# Patient Record
Sex: Female | Born: 1965 | Race: White | Hispanic: No | Marital: Single | Smoking: Never smoker
Health system: Southern US, Community
[De-identification: ages and names within clinical notes are randomized; demographics above are authoritative.]

## PROBLEM LIST (undated history)

## (undated) DIAGNOSIS — M2550 Pain in unspecified joint: Secondary | ICD-10-CM

## (undated) DIAGNOSIS — G4730 Sleep apnea, unspecified: Secondary | ICD-10-CM

## (undated) DIAGNOSIS — M1990 Unspecified osteoarthritis, unspecified site: Secondary | ICD-10-CM

## (undated) HISTORY — PX: OTHER SURGICAL HISTORY: SHX169

## (undated) HISTORY — PX: WISDOM TOOTH EXTRACTION: SHX21

## (undated) HISTORY — DX: Pain in unspecified joint: M25.50

---

## 2007-04-24 ENCOUNTER — Emergency Department: Payer: Self-pay | Admitting: Emergency Medicine

## 2007-07-03 ENCOUNTER — Emergency Department: Payer: Self-pay

## 2007-11-10 ENCOUNTER — Ambulatory Visit: Payer: Self-pay

## 2009-03-28 ENCOUNTER — Ambulatory Visit: Payer: Self-pay

## 2012-12-30 HISTORY — PX: KNEE ARTHROSCOPY: SUR90

## 2013-12-01 ENCOUNTER — Other Ambulatory Visit (HOSPITAL_COMMUNITY): Payer: Self-pay | Admitting: Orthopedic Surgery

## 2013-12-01 DIAGNOSIS — M25562 Pain in left knee: Secondary | ICD-10-CM

## 2013-12-03 ENCOUNTER — Ambulatory Visit (HOSPITAL_COMMUNITY)
Admission: RE | Admit: 2013-12-03 | Discharge: 2013-12-03 | Disposition: A | Discharge status: Home / Self Care | Payer: BC Managed Care – PPO | Source: Ambulatory Visit | Attending: Orthopedic Surgery | Admitting: Orthopedic Surgery

## 2013-12-03 DIAGNOSIS — M25569 Pain in unspecified knee: Secondary | ICD-10-CM | POA: Insufficient documentation

## 2013-12-03 DIAGNOSIS — M76899 Other specified enthesopathies of unspecified lower limb, excluding foot: Secondary | ICD-10-CM | POA: Insufficient documentation

## 2013-12-03 DIAGNOSIS — M25469 Effusion, unspecified knee: Secondary | ICD-10-CM | POA: Insufficient documentation

## 2013-12-03 DIAGNOSIS — M6750 Plica syndrome, unspecified knee: Secondary | ICD-10-CM | POA: Insufficient documentation

## 2013-12-03 DIAGNOSIS — IMO0002 Reserved for concepts with insufficient information to code with codable children: Secondary | ICD-10-CM | POA: Insufficient documentation

## 2013-12-03 DIAGNOSIS — M25562 Pain in left knee: Secondary | ICD-10-CM

## 2013-12-03 DIAGNOSIS — X58XXXA Exposure to other specified factors, initial encounter: Secondary | ICD-10-CM | POA: Insufficient documentation

## 2013-12-09 ENCOUNTER — Ambulatory Visit (HOSPITAL_COMMUNITY): Payer: Self-pay

## 2015-10-24 ENCOUNTER — Other Ambulatory Visit (HOSPITAL_COMMUNITY): Payer: Self-pay | Admitting: Internal Medicine

## 2015-10-24 ENCOUNTER — Ambulatory Visit (HOSPITAL_COMMUNITY)
Admission: RE | Admit: 2015-10-24 | Discharge: 2015-10-24 | Disposition: A | Discharge status: Home / Self Care | Payer: BC Managed Care – PPO | Source: Ambulatory Visit | Attending: Internal Medicine | Admitting: Internal Medicine

## 2015-10-24 DIAGNOSIS — M25562 Pain in left knee: Secondary | ICD-10-CM | POA: Diagnosis present

## 2015-10-24 DIAGNOSIS — M25762 Osteophyte, left knee: Secondary | ICD-10-CM | POA: Diagnosis not present

## 2015-10-24 DIAGNOSIS — W19XXXA Unspecified fall, initial encounter: Secondary | ICD-10-CM

## 2018-07-28 ENCOUNTER — Encounter: Payer: Self-pay | Admitting: Internal Medicine

## 2018-10-01 ENCOUNTER — Other Ambulatory Visit (HOSPITAL_BASED_OUTPATIENT_CLINIC_OR_DEPARTMENT_OTHER): Payer: Self-pay

## 2018-10-01 DIAGNOSIS — G4733 Obstructive sleep apnea (adult) (pediatric): Secondary | ICD-10-CM

## 2018-10-01 DIAGNOSIS — R0683 Snoring: Secondary | ICD-10-CM

## 2018-10-14 ENCOUNTER — Ambulatory Visit: Payer: BC Managed Care – PPO | Attending: Internal Medicine | Admitting: Neurology

## 2018-10-14 ENCOUNTER — Encounter: Payer: Self-pay | Admitting: Gastroenterology

## 2018-10-14 ENCOUNTER — Telehealth: Payer: Self-pay

## 2018-10-14 ENCOUNTER — Ambulatory Visit: Payer: BC Managed Care – PPO | Admitting: Gastroenterology

## 2018-10-14 ENCOUNTER — Other Ambulatory Visit: Payer: Self-pay

## 2018-10-14 DIAGNOSIS — Z1211 Encounter for screening for malignant neoplasm of colon: Secondary | ICD-10-CM | POA: Insufficient documentation

## 2018-10-14 DIAGNOSIS — G4733 Obstructive sleep apnea (adult) (pediatric): Secondary | ICD-10-CM | POA: Diagnosis not present

## 2018-10-14 DIAGNOSIS — R0683 Snoring: Secondary | ICD-10-CM

## 2018-10-14 MED ORDER — PEG 3350-KCL-NA BICARB-NACL 420 G PO SOLR: 4000.0000 mL | ORAL | 0 refills | Status: DC

## 2018-10-14 MED ORDER — NA SULFATE-K SULFATE-MG SULF 17.5-3.13-1.6 GM/177ML PO SOLN: 1.0000 | ORAL | 0 refills | Status: DC

## 2018-10-14 NOTE — H&P (View-Only) (Signed)
Primary Care Physician:  Asencion Noble, MD Primary Gastroenterologist:  Dr. Gala Romney   Chief Complaint  Patient presents with  . Consult    TCS- has no had TCS prior, no FH of colon cancer    HPI:   Susan Robertson is a 52 y.o. female presenting today at the request of Dr. Willey Blade to arrange initial screening colonoscopy. She has no family history of colorectal cancer. Father had colonoscopy at age 65 with simple adenomas.   No abdominal pain, N/V, GERD, dysphagia, overt GI bleeding, changes in bowel habits. She has a sleep study tonight. No prior diagnosis of sleep apnea. Told that she snores. No concerning GI issues. She was brought in today due to need for Propofol (BMI 52).   Past Medical History:  Diagnosis Date  . Joint pain     Past Surgical History:  Procedure Laterality Date  . ingrown toenail    . KNEE ARTHROSCOPY  2014  . WISDOM TOOTH EXTRACTION      Current Outpatient Medications  Medication Sig Dispense Refill  . Homeopathic Products (ZICAM COLD REMEDY PO) Take by mouth as needed.    Marland Kitchen ibuprofen (ADVIL,MOTRIN) 200 MG tablet Take 200 mg by mouth every 6 (six) hours as needed.    . Pseudoephedrine-Ibuprofen 30-200 MG TABS Take by mouth as needed.    . Na Sulfate-K Sulfate-Mg Sulf (SUPREP BOWEL PREP KIT) 17.5-3.13-1.6 GM/177ML SOLN Take 1 kit by mouth as directed. 1 Bottle 0  . polyethylene glycol-electrolytes (TRILYTE) 420 g solution Take 4,000 mLs by mouth as directed. 4000 mL 0   No current facility-administered medications for this visit.     Allergies as of 10/14/2018  . (No Known Allergies)    Family History  Problem Relation Age of Onset  . Colon polyps Father 16  . Colon cancer Neg Hx     Social History   Socioeconomic History  . Marital status: Single    Spouse name: Not on file  . Number of children: Not on file  . Years of education: Not on file  . Highest education level: Not on file  Occupational History    Comment: nurse educator  Social  Needs  . Financial resource strain: Not on file  . Food insecurity:    Worry: Not on file    Inability: Not on file  . Transportation needs:    Medical: Not on file    Non-medical: Not on file  Tobacco Use  . Smoking status: Never Smoker  . Smokeless tobacco: Never Used  Substance and Sexual Activity  . Alcohol use: Yes    Comment: occas  . Drug use: Never  . Sexual activity: Not on file  Lifestyle  . Physical activity:    Days per week: Not on file    Minutes per session: Not on file  . Stress: Not on file  Relationships  . Social connections:    Talks on phone: Not on file    Gets together: Not on file    Attends religious service: Not on file    Active member of club or organization: Not on file    Attends meetings of clubs or organizations: Not on file    Relationship status: Not on file  . Intimate partner violence:    Fear of current or ex partner: Not on file    Emotionally abused: Not on file    Physically abused: Not on file    Forced sexual activity: Not on file  Other  Topics Concern  . Not on file  Social History Narrative  . Not on file    Review of Systems: Gen: Denies any fever, chills, fatigue, weight loss, lack of appetite.  CV: Denies chest pain, heart palpitations, peripheral edema, syncope.  Resp: Denies shortness of breath at rest or with exertion. Denies wheezing or cough.  GI: see HPI  GU : Denies urinary burning, urinary frequency, urinary hesitancy MS: Denies joint pain, muscle weakness, cramps, or limitation of movement.  Derm: Denies rash, itching, dry skin Psych: Denies depression, anxiety, memory loss, and confusion Heme: Denies bruising, bleeding, and enlarged lymph nodes.  Physical Exam: BP (!) 144/91   Pulse 80   Temp (!) 97 F (36.1 C) (Oral)   Ht 5' 7"  (1.702 m)   Wt (!) 335 lb 3.2 oz (152 kg)   LMP 07/30/2018   BMI 52.50 kg/m  General:   Alert and oriented. Pleasant and cooperative. Well-nourished and well-developed.    Head:  Normocephalic and atraumatic. Eyes:  Without icterus, sclera clear and conjunctiva pink.  Ears:  Normal auditory acuity. Nose:  No deformity, discharge,  or lesions. Mouth:  No deformity or lesions, oral mucosa pink.  Lungs:  Clear to auscultation bilaterally. No wheezes, rales, or rhonchi. No distress.  Heart:  S1, S2 present without murmurs appreciated.  Abdomen:  +BS, soft, non-tender and non-distended. No HSM noted. No guarding or rebound. No masses appreciated.  Rectal:  Deferred  Msk:  Symmetrical without gross deformities. Normal posture. Extremities:  Without  edema. Neurologic:  Alert and  oriented x4 Psych:  Alert and cooperative. Normal mood and affect.

## 2018-10-14 NOTE — Telephone Encounter (Signed)
Tried to call pt to inform of pre-op appt 10/28/18 at 1:15pm, no answer, LMOVM. Letter mailed.

## 2018-10-14 NOTE — Assessment & Plan Note (Signed)
52 year old very pleasant female due for initial screening colonoscopy. She has no concerning lower or upper GI signs/symptoms. Personal family history of colon polyps in father, but he was age 27. No FH colorectal cancer.   Proceed with TCS with Dr. Jena Gauss in near future: the risks, benefits, and alternatives have been discussed with the patient in detail. The patient states understanding and desires to proceed. Propofol due to BMI 52

## 2018-10-14 NOTE — Patient Instructions (Signed)
We have scheduled you for a colonoscopy with Dr. Rourk in the near future!  Further recommendations to follow.  It was a pleasure to see you today. I strive to create trusting relationships with patients to provide genuine, compassionate, and quality care. I value your feedback. If you receive a survey regarding your visit,  I greatly appreciate you taking time to fill this out.   Gae Bihl W. Amaree Leeper, PhD, ANP-BC [facility]    

## 2018-10-14 NOTE — Progress Notes (Signed)
Primary Care Physician:  Asencion Noble, MD Primary Gastroenterologist:  Dr. Gala Romney   Chief Complaint  Patient presents with  . Consult    TCS- has no had TCS prior, no FH of colon cancer    HPI:   Susan Robertson is a 52 y.o. female presenting today at the request of Dr. Willey Blade to arrange initial screening colonoscopy. She has no family history of colorectal cancer. Father had colonoscopy at age 82 with simple adenomas.   No abdominal pain, N/V, GERD, dysphagia, overt GI bleeding, changes in bowel habits. She has a sleep study tonight. No prior diagnosis of sleep apnea. Told that she snores. No concerning GI issues. She was brought in today due to need for Propofol (BMI 52).   Past Medical History:  Diagnosis Date  . Joint pain     Past Surgical History:  Procedure Laterality Date  . ingrown toenail    . KNEE ARTHROSCOPY  2014  . WISDOM TOOTH EXTRACTION      Current Outpatient Medications  Medication Sig Dispense Refill  . Homeopathic Products (ZICAM COLD REMEDY PO) Take by mouth as needed.    Marland Kitchen ibuprofen (ADVIL,MOTRIN) 200 MG tablet Take 200 mg by mouth every 6 (six) hours as needed.    . Pseudoephedrine-Ibuprofen 30-200 MG TABS Take by mouth as needed.    . Na Sulfate-K Sulfate-Mg Sulf (SUPREP BOWEL PREP KIT) 17.5-3.13-1.6 GM/177ML SOLN Take 1 kit by mouth as directed. 1 Bottle 0  . polyethylene glycol-electrolytes (TRILYTE) 420 g solution Take 4,000 mLs by mouth as directed. 4000 mL 0   No current facility-administered medications for this visit.     Allergies as of 10/14/2018  . (No Known Allergies)    Family History  Problem Relation Age of Onset  . Colon polyps Father 23  . Colon cancer Neg Hx     Social History   Socioeconomic History  . Marital status: Single    Spouse name: Not on file  . Number of children: Not on file  . Years of education: Not on file  . Highest education level: Not on file  Occupational History    Comment: nurse educator  Social  Needs  . Financial resource strain: Not on file  . Food insecurity:    Worry: Not on file    Inability: Not on file  . Transportation needs:    Medical: Not on file    Non-medical: Not on file  Tobacco Use  . Smoking status: Never Smoker  . Smokeless tobacco: Never Used  Substance and Sexual Activity  . Alcohol use: Yes    Comment: occas  . Drug use: Never  . Sexual activity: Not on file  Lifestyle  . Physical activity:    Days per week: Not on file    Minutes per session: Not on file  . Stress: Not on file  Relationships  . Social connections:    Talks on phone: Not on file    Gets together: Not on file    Attends religious service: Not on file    Active member of club or organization: Not on file    Attends meetings of clubs or organizations: Not on file    Relationship status: Not on file  . Intimate partner violence:    Fear of current or ex partner: Not on file    Emotionally abused: Not on file    Physically abused: Not on file    Forced sexual activity: Not on file  Other  Topics Concern  . Not on file  Social History Narrative  . Not on file    Review of Systems: Gen: Denies any fever, chills, fatigue, weight loss, lack of appetite.  CV: Denies chest pain, heart palpitations, peripheral edema, syncope.  Resp: Denies shortness of breath at rest or with exertion. Denies wheezing or cough.  GI: see HPI  GU : Denies urinary burning, urinary frequency, urinary hesitancy MS: Denies joint pain, muscle weakness, cramps, or limitation of movement.  Derm: Denies rash, itching, dry skin Psych: Denies depression, anxiety, memory loss, and confusion Heme: Denies bruising, bleeding, and enlarged lymph nodes.  Physical Exam: BP (!) 144/91   Pulse 80   Temp (!) 97 F (36.1 C) (Oral)   Ht 5' 7"  (1.702 m)   Wt (!) 335 lb 3.2 oz (152 kg)   LMP 07/30/2018   BMI 52.50 kg/m  General:   Alert and oriented. Pleasant and cooperative. Well-nourished and well-developed.    Head:  Normocephalic and atraumatic. Eyes:  Without icterus, sclera clear and conjunctiva pink.  Ears:  Normal auditory acuity. Nose:  No deformity, discharge,  or lesions. Mouth:  No deformity or lesions, oral mucosa pink.  Lungs:  Clear to auscultation bilaterally. No wheezes, rales, or rhonchi. No distress.  Heart:  S1, S2 present without murmurs appreciated.  Abdomen:  +BS, soft, non-tender and non-distended. No HSM noted. No guarding or rebound. No masses appreciated.  Rectal:  Deferred  Msk:  Symmetrical without gross deformities. Normal posture. Extremities:  Without  edema. Neurologic:  Alert and  oriented x4 Psych:  Alert and cooperative. Normal mood and affect.

## 2018-10-14 NOTE — Progress Notes (Signed)
CC'D TO PCP °

## 2018-10-22 NOTE — Patient Instructions (Signed)
Susan Robertson  10/22/2018     @PREFPERIOPPHARMACY @   Your procedure is scheduled on  11/05/2018 .  Report to [facility] at  1200  P.M.  Call this number if you have problems the morning of surgery:  [phone]   Remember:  Follow the diet and prep instructions given to you by Dr Luvenia Starch office.                      Take these medicines the morning of surgery with A SIP OF WATER  None    Do not wear jewelry, make-up or nail polish.  Do not wear lotions, powders, or perfumes, or deodorant.  Do not shave 48 hours prior to surgery.  Men may shave face and neck.  Do not bring valuables to the hospital.  [facility] is not responsible for any belongings or valuables.  Contacts, dentures or bridgework may not be worn into surgery.  Leave your suitcase in the car.  After surgery it may be brought to your room.  For patients admitted to the hospital, discharge time will be determined by your treatment team.  Patients discharged the day of surgery will not be allowed to drive home.   Name and phone number of your driver:   family Special instructions:  None  Please read over the following fact sheets that you were given. Anesthesia Post-op Instructions and Care and Recovery After Surgery       Colonoscopy, Adult A colonoscopy is an exam to look at the large intestine. It is done to check for problems, such as:  Lumps (tumors).  Growths (polyps).  Swelling (inflammation).  Bleeding.  What happens before the procedure? Eating and drinking Follow instructions from your doctor about eating and drinking. These instructions may include:  A few days before the procedure - follow a low-fiber diet. ? Avoid nuts. ? Avoid seeds. ? Avoid dried fruit. ? Avoid raw fruits. ? Avoid vegetables.  1-3 days before the procedure - follow a clear liquid diet. Avoid liquids that have red or purple dye. Drink only clear liquids, such as: ? Clear broth or  bouillon. ? Black coffee or tea. ? Clear juice. ? Clear soft drinks or sports drinks. ? Gelatin dessert. ? Popsicles.  On the day of the procedure - do not eat or drink anything during the 2 hours before the procedure.  Bowel prep If you were prescribed an oral bowel prep:  Take it as told by your doctor. Starting the day before your procedure, you will need to drink a lot of liquid. The liquid will cause you to poop (have bowel movements) until your poop is almost clear or light green.  If your skin or butt gets irritated from diarrhea, you may: ? Wipe the area with wipes that have medicine in them, such as adult wet wipes with aloe and vitamin E. ? Put something on your skin that soothes the area, such as petroleum jelly.  If you throw up (vomit) while drinking the bowel prep, take a break for up to 60 minutes. Then begin the bowel prep again. If you keep throwing up and you cannot take the bowel prep without throwing up, call your doctor.  General instructions  Ask your doctor about changing or stopping your normal medicines. This is important if you take diabetes medicines or blood thinners.  Plan to have someone take you home from the hospital or clinic.  What happens during the procedure?  An IV tube may be put into one of your veins.  You will be given medicine to help you relax (sedative).  To reduce your risk of infection: ? Your doctors will wash their hands. ? Your anal area will be washed with soap.  You will be asked to lie on your side with your knees bent.  Your doctor will get a long, thin, flexible tube ready. The tube will have a camera and a light on the end.  The tube will be put into your anus.  The tube will be gently put into your large intestine.  Air will be delivered into your large intestine to keep it open. You may feel some pressure or cramping.  The camera will be used to take photos.  A small tissue sample may be removed from your body to  be looked at under a microscope (biopsy). If any possible problems are found, the tissue will be sent to a lab for testing.  If small growths are found, your doctor may remove them and have them checked for cancer.  The tube that was put into your anus will be slowly removed. The procedure may vary among doctors and hospitals. What happens after the procedure?  Your doctor will check on you often until the medicines you were given have worn off.  Do not drive for 24 hours after the procedure.  You may have a small amount of blood in your poop.  You may pass gas.  You may have mild cramps or bloating in your belly (abdomen).  It is up to you to get the results of your procedure. Ask your doctor, or the department performing the procedure, when your results will be ready. This information is not intended to replace advice given to you by your health care provider. Make sure you discuss any questions you have with your health care provider. Document Released: 01/18/2011 Document Revised: 10/16/2016 Document Reviewed: 02/27/2016 Elsevier Interactive Patient Education  2017 [employer]  Colonoscopy, Adult, Care After This sheet gives you information about how to care for yourself after your procedure. Your health care provider may also give you more specific instructions. If you have problems or questions, contact your health care provider. What can I expect after the procedure? After the procedure, it is common to have:  A small amount of blood in your stool for 24 hours after the procedure.  Some gas.  Mild abdominal cramping or bloating.  Follow these instructions at home: General instructions   For the first 24 hours after the procedure: ? Do not drive or use machinery. ? Do not sign important documents. ? Do not drink alcohol. ? Do your regular daily activities at a slower pace than normal. ? Eat soft, easy-to-digest foods. ? Rest often.  Take over-the-counter or  prescription medicines only as told by your health care provider.  It is up to you to get the results of your procedure. Ask your health care provider, or the department performing the procedure, when your results will be ready. Relieving cramping and bloating  Try walking around when you have cramps or feel bloated.  Apply heat to your abdomen as told by your health care provider. Use a heat source that your health care provider recommends, such as a moist heat pack or a heating pad. ? Place a towel between your skin and the heat source. ? Leave the heat on for 20-30 minutes. ? Remove the heat if your  skin turns bright red. This is especially important if you are unable to feel pain, heat, or cold. You may have a greater risk of getting burned. Eating and drinking  Drink enough fluid to keep your urine clear or pale yellow.  Resume your normal diet as instructed by your health care provider. Avoid heavy or fried foods that are hard to digest.  Avoid drinking alcohol for as long as instructed by your health care provider. Contact a health care provider if:  You have blood in your stool 2-3 days after the procedure. Get help right away if:  You have more than a small spotting of blood in your stool.  You pass large blood clots in your stool.  Your abdomen is swollen.  You have nausea or vomiting.  You have a fever.  You have increasing abdominal pain that is not relieved with medicine. This information is not intended to replace advice given to you by your health care provider. Make sure you discuss any questions you have with your health care provider. Document Released: 07/30/2004 Document Revised: 09/09/2016 Document Reviewed: 02/27/2016 Elsevier Interactive Patient Education  2018 [place] Anesthesia is a term that refers to techniques, procedures, and medicines that help a person stay safe and comfortable during a medical procedure.  Monitored anesthesia care, or sedation, is one type of anesthesia. Your anesthesia specialist may recommend sedation if you will be having a procedure that does not require you to be unconscious, such as:  Cataract surgery.  A dental procedure.  A biopsy.  A colonoscopy.  During the procedure, you may receive a medicine to help you relax (sedative). There are three levels of sedation:  Mild sedation. At this level, you may feel awake and relaxed. You will be able to follow directions.  Moderate sedation. At this level, you will be sleepy. You may not remember the procedure.  Deep sedation. At this level, you will be asleep. You will not remember the procedure.  The more medicine you are given, the deeper your level of sedation will be. Depending on how you respond to the procedure, the anesthesia specialist may change your level of sedation or the type of anesthesia to fit your needs. An anesthesia specialist will monitor you closely during the procedure. Let your health care provider know about:  Any allergies you have.  All medicines you are taking, including vitamins, herbs, eye drops, creams, and over-the-counter medicines.  Any use of steroids (by mouth or as a cream).  Any problems you or family members have had with sedatives and anesthetic medicines.  Any blood disorders you have.  Any surgeries you have had.  Any medical conditions you have, such as sleep apnea.  Whether you are pregnant or may be pregnant.  Any use of cigarettes, alcohol, or street drugs. What are the risks? Generally, this is a safe procedure. However, problems may occur, including:  Getting too much medicine (oversedation).  Nausea.  Allergic reaction to medicines.  Trouble breathing. If this happens, a breathing tube may be used to help with breathing. It will be removed when you are awake and breathing on your own.  Heart trouble.  Lung trouble.  Before the procedure Staying  hydrated Follow instructions from your health care provider about hydration, which may include:  Up to 2 hours before the procedure - you may continue to drink clear liquids, such as water, clear fruit juice, black coffee, and plain tea.  Eating and drinking restrictions Follow  instructions from your health care provider about eating and drinking, which may include:  8 hours before the procedure - stop eating heavy meals or foods such as meat, fried foods, or fatty foods.  6 hours before the procedure - stop eating light meals or foods, such as toast or cereal.  6 hours before the procedure - stop drinking milk or drinks that contain milk.  2 hours before the procedure - stop drinking clear liquids.  Medicines Ask your health care provider about:  Changing or stopping your regular medicines. This is especially important if you are taking diabetes medicines or blood thinners.  Taking medicines such as aspirin and ibuprofen. These medicines can thin your blood. Do not take these medicines before your procedure if your health care provider instructs you not to.  Tests and exams  You will have a physical exam.  You may have blood tests done to show: ? How well your kidneys and liver are working. ? How well your blood can clot.  General instructions  Plan to have someone take you home from the hospital or clinic.  If you will be going home right after the procedure, plan to have someone with you for 24 hours.  What happens during the procedure?  Your blood pressure, heart rate, breathing, level of pain and overall condition will be monitored.  An IV tube will be inserted into one of your veins.  Your anesthesia specialist will give you medicines as needed to keep you comfortable during the procedure. This may mean changing the level of sedation.  The procedure will be performed. After the procedure  Your blood pressure, heart rate, breathing rate, and blood oxygen level  will be monitored until the medicines you were given have worn off.  Do not drive for 24 hours if you received a sedative.  You may: ? Feel sleepy, clumsy, or nauseous. ? Feel forgetful about what happened after the procedure. ? Have a sore throat if you had a breathing tube during the procedure. ? Vomit. This information is not intended to replace advice given to you by your health care provider. Make sure you discuss any questions you have with your health care provider. Document Released: 09/11/2005 Document Revised: 05/24/2016 Document Reviewed: 04/07/2016 Elsevier Interactive Patient Education  2018 [place], Care After These instructions provide you with information about caring for yourself after your procedure. Your health care provider may also give you more specific instructions. Your treatment has been planned according to current medical practices, but problems sometimes occur. Call your health care provider if you have any problems or questions after your procedure. What can I expect after the procedure? After your procedure, it is common to:  Feel sleepy for several hours.  Feel clumsy and have poor balance for several hours.  Feel forgetful about what happened after the procedure.  Have poor judgment for several hours.  Feel nauseous or vomit.  Have a sore throat if you had a breathing tube during the procedure.  Follow these instructions at home: For at least 24 hours after the procedure:   Do not: ? Participate in activities in which you could fall or become injured. ? Drive. ? Use heavy machinery. ? Drink alcohol. ? Take sleeping pills or medicines that cause drowsiness. ? Make important decisions or sign legal documents. ? Take care of children on your own.  Rest. Eating and drinking  Follow the diet that is recommended by your health care provider.  If  you vomit, drink water, juice, or soup when you can drink without  vomiting.  Make sure you have little or no nausea before eating solid foods. General instructions  Have a responsible adult stay with you until you are awake and alert.  Take over-the-counter and prescription medicines only as told by your health care provider.  If you smoke, do not smoke without supervision.  Keep all follow-up visits as told by your health care provider. This is important. Contact a health care provider if:  You keep feeling nauseous or you keep vomiting.  You feel light-headed.  You develop a rash.  You have a fever. Get help right away if:  You have trouble breathing. This information is not intended to replace advice given to you by your health care provider. Make sure you discuss any questions you have with your health care provider. Document Released: 04/07/2016 Document Revised: 08/07/2016 Document Reviewed: 04/07/2016 Elsevier Interactive Patient Education  [employer].

## 2018-10-28 ENCOUNTER — Other Ambulatory Visit (HOSPITAL_COMMUNITY): Payer: BC Managed Care – PPO

## 2018-10-29 ENCOUNTER — Encounter (HOSPITAL_COMMUNITY): Payer: Self-pay

## 2018-10-29 ENCOUNTER — Encounter (HOSPITAL_COMMUNITY)
Admission: RE | Admit: 2018-10-29 | Discharge: 2018-10-29 | Disposition: A | Discharge status: Home / Self Care | Payer: BC Managed Care – PPO | Source: Ambulatory Visit | Attending: Internal Medicine | Admitting: Internal Medicine

## 2018-10-29 ENCOUNTER — Other Ambulatory Visit: Payer: Self-pay

## 2018-10-29 DIAGNOSIS — Z01812 Encounter for preprocedural laboratory examination: Secondary | ICD-10-CM | POA: Diagnosis not present

## 2018-10-29 HISTORY — DX: Sleep apnea, unspecified: G47.30

## 2018-10-29 HISTORY — DX: Unspecified osteoarthritis, unspecified site: M19.90

## 2018-10-29 LAB — CBC WITH DIFFERENTIAL/PLATELET
Abs Immature Granulocytes: 0.03 10*3/uL (ref 0.00–0.07)
BASOS ABS: 0 10*3/uL (ref 0.0–0.1)
BASOS PCT: 0 %
EOS ABS: 0.1 10*3/uL (ref 0.0–0.5)
Eosinophils Relative: 2 %
HCT: 42.7 % (ref 36.0–46.0)
Hemoglobin: 12.8 g/dL (ref 12.0–15.0)
IMMATURE GRANULOCYTES: 0 %
Lymphocytes Relative: 24 %
Lymphs Abs: 1.8 10*3/uL (ref 0.7–4.0)
MCH: 26.9 pg (ref 26.0–34.0)
MCHC: 30 g/dL (ref 30.0–36.0)
MCV: 89.9 fL (ref 80.0–100.0)
Monocytes Absolute: 0.5 10*3/uL (ref 0.1–1.0)
Monocytes Relative: 7 %
NEUTROS PCT: 67 %
NRBC: 0 % (ref 0.0–0.2)
Neutro Abs: 4.8 10*3/uL (ref 1.7–7.7)
PLATELETS: 328 10*3/uL (ref 150–400)
RBC: 4.75 MIL/uL (ref 3.87–5.11)
RDW: 13.5 % (ref 11.5–15.5)
WBC: 7.2 10*3/uL (ref 4.0–10.5)

## 2018-10-29 LAB — HCG, SERUM, QUALITATIVE: Preg, Serum: NEGATIVE

## 2018-10-31 NOTE — Procedures (Signed)
Bath A. Merlene Laughter, MD     [email or web address]             NOCTURNAL POLYSOMNOGRAPHY   LOCATION: [facility]     Patient Name: Susan Robertson, Susan Robertson Date: 10/14/2018 Gender: Female D.O.B: 27-Sep-1966 Age (years): 52 Referring Provider: Asencion Noble Height (inches): 23 Interpreting Physician: Phillips Odor MD, ABSM Weight (lbs): 320 RPSGT: Peak, Robert BMI: 50 MRN: [id number] Neck Size: 17.00 CLINICAL INFORMATION Sleep Study Type: Split Night CPAP     Indication for sleep study: OSA, Snoring     Epworth Sleepiness Score: 12  SLEEP STUDY TECHNIQUE As per the AASM Manual for the Scoring of Sleep and Associated Events v2.3 (April 2016) with a hypopnea requiring 4% desaturations.  The channels recorded and monitored were frontal, central and occipital EEG, electrooculogram (EOG), submentalis EMG (chin), nasal and oral airflow, thoracic and abdominal wall motion, anterior tibialis EMG, snore microphone, electrocardiogram, and pulse oximetry. Continuous positive airway pressure (CPAP) was initiated when the patient met split night criteria and was titrated according to treat sleep-disordered breathing.  MEDICATIONS Medications self-administered by patient taken the night of the study : N/A  Current Outpatient Medications:  .  ibuprofen (ADVIL,MOTRIN) 200 MG tablet, Take 400-600 mg by mouth every 6 (six) hours as needed for headache or moderate pain. , Disp: , Rfl:  .  Na Sulfate-K Sulfate-Mg Sulf (SUPREP BOWEL PREP KIT) 17.5-3.13-1.6 GM/177ML SOLN, Take 1 kit by mouth as directed. (Patient not taking: Reported on 10/27/2018), Disp: 1 Bottle, Rfl: 0 .  polyethylene glycol-electrolytes (TRILYTE) 420 g solution, Take 4,000 mLs by mouth as directed., Disp: 4000 mL, Rfl: 0  RESPIRATORY PARAMETERS Diagnostic  Total AHI (/hr): 68.3 RDI (/hr): 107.8 OA Index (/hr): 26.8 CA Index (/hr): 0.5 REM AHI (/hr): N/A NREM AHI (/hr): 68.3 Supine AHI  (/hr): 71.7 Non-supine AHI (/hr): 62.6 Min O2 Sat (%): 84.0 Mean O2 (%): 94.4 Time below 88% (min): 0.6   Titration  Optimal Pressure (cm): 11 AHI at Optimal Pressure (/hr): 0.9 Min O2 at Optimal Pressure (%): 84.0 Supine % at Optimal (%): 100 Sleep % at Optimal (%): 95   SLEEP ARCHITECTURE The recording time for the entire night was 404.4 minutes.  During a baseline period of 188.5 minutes, the patient slept for 123.0 minutes in REM and nonREM, yielding a sleep efficiency of 65.2%%. Sleep onset after lights out was 17.6 minutes with a REM latency of N/A minutes. The patient spent 28.0%% of the night in stage N1 sleep, 72.0%% in stage N2 sleep, 0.0%% in stage N3 and 0% in REM.     During the titration period of 200.7 minutes, the patient slept for 189.3 minutes in REM and nonREM, yielding a sleep efficiency of 94.3%%. Sleep onset after CPAP initiation was 5.0 minutes with a REM latency of 37.0 minutes. The patient spent 4.0%% of the night in stage N1 sleep, 61.8%% in stage N2 sleep, 0.3%% in stage N3 and 34% in REM.  CARDIAC DATA The 2 lead EKG demonstrated sinus rhythm. The mean heart rate was 100.0 beats per minute. Other EKG findings include: None.  LEG MOVEMENT DATA The total Periodic Limb Movements of Sleep (PLMS) were 0. The PLMS index was 0.0.  IMPRESSIONS Severe obstructive sleep apnea occurred during the diagnostic portion of the study (AHI = 68.3/hour). The optimal CPAP selected for this patient is ( 11 cm of water)    Delano Metz, MD Diplomate, American Board of Sleep Medicine.  ELECTRONICALLY SIGNED ON:  10/31/2018,  11:53 AM [place] PH: [phone]   FX: [phone] [place]

## 2018-11-05 ENCOUNTER — Ambulatory Visit (HOSPITAL_COMMUNITY): Payer: BC Managed Care – PPO | Admitting: Anesthesiology

## 2018-11-05 ENCOUNTER — Ambulatory Visit (HOSPITAL_COMMUNITY)
Admission: RE | Admit: 2018-11-05 | Discharge: 2018-11-05 | Disposition: A | Discharge status: Home / Self Care | Payer: BC Managed Care – PPO | Source: Ambulatory Visit | Attending: Internal Medicine | Admitting: Internal Medicine

## 2018-11-05 ENCOUNTER — Other Ambulatory Visit: Payer: Self-pay

## 2018-11-05 ENCOUNTER — Encounter (HOSPITAL_COMMUNITY)
Admission: RE | Disposition: A | Discharge status: Home / Self Care | Payer: Self-pay | Source: Ambulatory Visit | Attending: Internal Medicine

## 2018-11-05 ENCOUNTER — Encounter (HOSPITAL_COMMUNITY): Payer: Self-pay | Admitting: Emergency Medicine

## 2018-11-05 DIAGNOSIS — Z8371 Family history of colonic polyps: Secondary | ICD-10-CM | POA: Diagnosis present

## 2018-11-05 DIAGNOSIS — Z1211 Encounter for screening for malignant neoplasm of colon: Secondary | ICD-10-CM | POA: Insufficient documentation

## 2018-11-05 HISTORY — PX: COLONOSCOPY WITH PROPOFOL: SHX5780

## 2018-11-05 SURGERY — COLONOSCOPY WITH PROPOFOL
Anesthesia: Monitor Anesthesia Care

## 2018-11-05 MED ORDER — ONDANSETRON HCL 4 MG/2ML IJ SOLN
INTRAMUSCULAR | Status: DC | PRN
  Administered 2018-11-05: 4 mg via INTRAVENOUS

## 2018-11-05 MED ORDER — ONDANSETRON HCL 4 MG/2ML IJ SOLN
INTRAMUSCULAR | Status: AC
  Filled 2018-11-05: qty 2

## 2018-11-05 MED ORDER — LACTATED RINGERS IV SOLN
INTRAVENOUS | Status: DC
  Administered 2018-11-05: 09:00:00 via INTRAVENOUS

## 2018-11-05 MED ORDER — CHLORHEXIDINE GLUCONATE CLOTH 2 % EX PADS: 6.0000 | MEDICATED_PAD | Freq: Once | CUTANEOUS | Status: DC

## 2018-11-05 MED ORDER — MIDAZOLAM HCL 5 MG/5ML IJ SOLN
INTRAMUSCULAR | Status: DC | PRN
  Administered 2018-11-05: 2 mg via INTRAVENOUS

## 2018-11-05 MED ORDER — PROPOFOL 500 MG/50ML IV EMUL
INTRAVENOUS | Status: DC | PRN
  Administered 2018-11-05: 135 ug/kg/min via INTRAVENOUS

## 2018-11-05 MED ORDER — STERILE WATER FOR IRRIGATION IR SOLN
Status: DC | PRN
  Administered 2018-11-05: 100 mL

## 2018-11-05 MED ORDER — MIDAZOLAM HCL 2 MG/2ML IJ SOLN
INTRAMUSCULAR | Status: AC
  Filled 2018-11-05: qty 2

## 2018-11-05 NOTE — Transfer of Care (Signed)
Immediate Anesthesia Transfer of Care Note  Patient: Susan Robertson  Procedure(s) Performed: COLONOSCOPY WITH PROPOFOL (N/A )  Patient Location: PACU  Anesthesia Type:MAC  Level of Consciousness: awake and patient cooperative  Airway & Oxygen Therapy: Patient Spontanous Breathing and Patient connected to nasal cannula oxygen  Post-op Assessment: Report given to RN, Post -op Vital signs reviewed and stable and Patient moving all extremities  Post vital signs: Reviewed and stable  Last Vitals:  Vitals Value Taken Time  BP    Temp    Pulse 55 11/05/2018 10:04 AM  Resp    SpO2 84 % 11/05/2018 10:04 AM  Vitals shown include unvalidated device data.  Last Pain:  Vitals:   11/05/18 0958  TempSrc:   PainSc: 0-No pain         Complications: No apparent anesthesia complications

## 2018-11-05 NOTE — Interval H&P Note (Signed)
History and Physical Interval Note:  11/05/2018 9:26 AM  Susan Robertson  has presented today for surgery, with the diagnosis of screening colonoscopy  The various methods of treatment have been discussed with the patient and family. After consideration of risks, benefits and other options for treatment, the patient has consented to  Procedure(s) with comments: COLONOSCOPY WITH PROPOFOL (N/A) - 1:30pm - pt knows to arrive at 8:00 as a surgical intervention .  The patient's history has been reviewed, patient examined, no change in status, stable for surgery.  I have reviewed the patient's chart and labs.  Questions were answered to the patient's satisfaction.    No change.  First ever average her screening colonoscopy today per plan.  The risks, benefits, limitations, alternatives and imponderables have been reviewed with the patient. Questions have been answered. All parties are agreeable.    Susan Robertson

## 2018-11-05 NOTE — Op Note (Signed)
[facility] Patient Name: Susan Robertson Procedure Date: 11/05/2018 9:11 AM MRN: [id number] Date of Birth: 1966-07-26 Attending MD: Gennette Pac , MD CSN: [id number] Age: 52 Admit Type: Outpatient Procedure:                Colonoscopy Indications:              Screening for colorectal malignant neoplasm Providers:                Gennette Pac, MD, Edrick Kins, RN, Dyann Ruddle Referring MD:              Medicines:                Propofol per Anesthesia Complications:            No immediate complications. Estimated Blood Loss:     Estimated blood loss: none. Procedure:                Pre-Anesthesia Assessment:                           - Prior to the procedure, a History and Physical                            was performed, and patient medications and                            allergies were reviewed. The patient's tolerance of                            previous anesthesia was also reviewed. The risks                            and benefits of the procedure and the sedation                            options and risks were discussed with the patient.                            All questions were answered, and informed consent                            was obtained. Prior Anticoagulants: The patient has                            taken no previous anticoagulant or antiplatelet                            agents. ASA Grade Assessment: III - A patient with                            severe systemic disease. After reviewing the risks  and benefits, the patient was deemed in                            satisfactory condition to undergo the procedure.                           After obtaining informed consent, the colonoscope                            was passed under direct vision. Throughout the                            procedure, the patient's blood pressure, pulse, and                            oxygen  saturations were monitored continuously. The                            [id number]-[id number] ([id number]) scope was introduced through                            the and advanced to the the cecum, identified by                            appendiceal orifice and ileocecal valve. The                            colonoscopy was performed without difficulty. The                            patient tolerated the procedure well. The quality                            of the bowel preparation was adequate. Scope In: 9:44:35 AM Scope Out: 9:57:30 AM Scope Withdrawal Time: 0 hours 7 minutes 44 seconds  Total Procedure Duration: 0 hours 12 minutes 55 seconds  Findings:      The perianal and digital rectal examinations were normal.      The colon (entire examined portion) appeared normal. Impression:               - The entire examined colon is normal.                           - No specimens collected. Moderate Sedation:      Moderate (conscious) sedation was personally administered by an       anesthesia professional. The following parameters were monitored: oxygen       saturation, heart rate, blood pressure, respiratory rate, EKG, adequacy       of pulmonary ventilation, and response to care. Recommendation:           - Patient has a contact number available for                            emergencies. The signs and symptoms of potential  delayed complications were discussed with the                            patient. Return to normal activities tomorrow.                            Written discharge instructions were provided to the                            patient.                           - Resume previous diet.                           - Continue present medications.                           - Repeat colonoscopy in 10 years for screening                            purposes.                           - Return to GI office PRN. Procedure Code(s):        --- Professional ---                            [phone], Colonoscopy, flexible; diagnostic, including                            collection of specimen(s) by brushing or washing,                            when performed (separate procedure) Diagnosis Code(s):        --- Professional ---                           Z12.11, Encounter for screening for malignant                            neoplasm of colon CPT copyright 2018 [employer] Association. All rights reserved. The codes documented in this report are preliminary and upon coder review may  be revised to meet current compliance requirements. [name]. [name], MD Gennette Pac, MD 11/05/2018 10:00:48 AM This report has been signed electronically. Number of Addenda: 0

## 2018-11-05 NOTE — Discharge Instructions (Signed)
°  Colonoscopy Discharge Instructions  Read the instructions outlined below and refer to this sheet in the next few weeks. These discharge instructions provide you with general information on caring for yourself after you leave the hospital. Your doctor may also give you specific instructions. While your treatment has been planned according to the most current medical practices available, unavoidable complications occasionally occur. If you have any problems or questions after discharge, call Dr. Jena Gauss at [phone]. ACTIVITY  You may resume your regular activity, but move at a slower pace for the next 24 hours.   Take frequent rest periods for the next 24 hours.   Walking will help get rid of the air and reduce the bloated feeling in your belly (abdomen).   No driving for 24 hours (because of the medicine (anesthesia) used during the test).    Do not sign any important legal documents or operate any machinery for 24 hours (because of the anesthesia used during the test).  NUTRITION  Drink plenty of fluids.   You may resume your normal diet as instructed by your doctor.   Begin with a light meal and progress to your normal diet. Heavy or fried foods are harder to digest and may make you feel sick to your stomach (nauseated).   Avoid alcoholic beverages for 24 hours or as instructed.  MEDICATIONS  You may resume your normal medications unless your doctor tells you otherwise.  WHAT YOU CAN EXPECT TODAY  Some feelings of bloating in the abdomen.   Passage of more gas than usual.   Spotting of blood in your stool or on the toilet paper.  IF YOU HAD POLYPS REMOVED DURING THE COLONOSCOPY:  No aspirin products for 7 days or as instructed.   No alcohol for 7 days or as instructed.   Eat a soft diet for the next 24 hours.  FINDING OUT THE RESULTS OF YOUR TEST Not all test results are available during your visit. If your test results are not back during the visit, make an appointment  with your caregiver to find out the results. Do not assume everything is normal if you have not heard from your caregiver or the medical facility. It is important for you to follow up on all of your test results.  SEEK IMMEDIATE MEDICAL ATTENTION IF:  You have more than a spotting of blood in your stool.   Your belly is swollen (abdominal distention).   You are nauseated or vomiting.   You have a temperature over 101.   You have abdominal pain or discomfort that is severe or gets worse throughout the day.    Your colonoscopy today was normal  Repeat screening colonoscopy in 10 years

## 2018-11-05 NOTE — Anesthesia Preprocedure Evaluation (Signed)
Anesthesia Evaluation  Patient identified by MRN, date of birth, ID band Patient awake    Reviewed: Allergy & Precautions, H&P , NPO status , Patient's Chart, lab work & pertinent test results, reviewed documented beta blocker date and time   Airway Mallampati: II  TM Distance: >3 FB Neck ROM: full  Mouth opening: Limited Mouth Opening  Dental no notable dental hx.    Pulmonary sleep apnea ,    Pulmonary exam normal breath sounds clear to auscultation       Cardiovascular Exercise Tolerance: Good negative cardio ROS   Rhythm:regular Rate:Normal     Neuro/Psych negative neurological ROS  negative psych ROS   GI/Hepatic negative GI ROS, Neg liver ROS,   Endo/Other  Morbid obesity  Renal/GU negative Renal ROS  negative genitourinary   Musculoskeletal   Abdominal   Peds  Hematology negative hematology ROS (+)   Anesthesia Other Findings   Reproductive/Obstetrics negative OB ROS                             Anesthesia Physical Anesthesia Plan  ASA: III  Anesthesia Plan: MAC   Post-op Pain Management:    Induction:   PONV Risk Score and Plan:   Airway Management Planned:   Additional Equipment:   Intra-op Plan:   Post-operative Plan:   Informed Consent: I have reviewed the patients History and Physical, chart, labs and discussed the procedure including the risks, benefits and alternatives for the proposed anesthesia with the patient or authorized representative who has indicated his/her understanding and acceptance.   Dental Advisory Given  Plan Discussed with: CRNA  Anesthesia Plan Comments:         Anesthesia Quick Evaluation

## 2018-11-05 NOTE — Anesthesia Postprocedure Evaluation (Signed)
Anesthesia Post Note  Patient: Susan Robertson  Procedure(s) Performed: COLONOSCOPY WITH PROPOFOL (N/A )  Patient location during evaluation: PACU Anesthesia Type: MAC Level of consciousness: awake and patient cooperative Pain management: pain level controlled Vital Signs Assessment: post-procedure vital signs reviewed and stable Respiratory status: spontaneous breathing, nonlabored ventilation and respiratory function stable Cardiovascular status: blood pressure returned to baseline Postop Assessment: no apparent nausea or vomiting Anesthetic complications: no     Last Vitals:  Vitals:   11/05/18 0813  BP: 136/74  Pulse: 78  Resp: (!) 21  Temp: 36.9 C  SpO2: 94%    Last Pain:  Vitals:   11/05/18 0958  TempSrc:   PainSc: 0-No pain                 [name]

## 2018-11-11 ENCOUNTER — Encounter (HOSPITAL_COMMUNITY): Payer: Self-pay | Admitting: Internal Medicine

## 2018-12-07 ENCOUNTER — Ambulatory Visit: Payer: BC Managed Care – PPO | Admitting: Pulmonary Disease

## 2018-12-07 ENCOUNTER — Encounter: Payer: Self-pay | Admitting: Pulmonary Disease

## 2018-12-07 DIAGNOSIS — G4733 Obstructive sleep apnea (adult) (pediatric): Secondary | ICD-10-CM | POA: Insufficient documentation

## 2018-12-07 DIAGNOSIS — G4720 Circadian rhythm sleep disorder, unspecified type: Secondary | ICD-10-CM | POA: Diagnosis not present

## 2018-12-07 NOTE — Progress Notes (Signed)
Subjective:    Patient ID: Susan Robertson, female    DOB: 07/22/1966, 52 y.o.   MRN: [id number]  HPI  Chief Complaint  Patient presents with  . Sleep Consult    Referred by Dr. Carylon Perches for possible OSA. Per patient, she had a SS back in October 2019 and was told that she had OSA.     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 She reports non-refreshing sleep for many years, she is single and room partner does have occasionally commented on her loud snoring.  She was recently at a conference and her friend observed her sleeping and witnessed apneas.  She reports excessive daytime somnolence and fatigue. Epworth sleepiness score is 10 and she reports sleepiness while sitting and reading, watching TV, lying down to rest in the afternoons. 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 Sleep latency is usually minimal, sleeps on her stomach on one for fibula, denies nocturnal awakenings or nocturia and is out of bed feeling tired with dryness of mouth but denies headaches. 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 Arthritis   . Joint pain   . Sleep apnea    in process of getting CPAP.   Past Surgical History:  Procedure Laterality Date  . COLONOSCOPY WITH PROPOFOL N/A 11/05/2018    Procedure: COLONOSCOPY WITH PROPOFOL;  Surgeon: Corbin Ade, MD;  Location: [facility];  Service: Endoscopy;  Laterality: N/A;  1:30pm - pt knows to arrive at 8:00  . ingrown toenail Bilateral    Great toes  . KNEE ARTHROSCOPY Left 2014  . WISDOM TOOTH EXTRACTION      No Known Allergies   Social History   Socioeconomic History  . Marital status: Single    Spouse name: Not on file  . Number of children: Not on file  . Years of education: Not on file  . Highest education level: Not on file  Occupational History    Comment: nurse educator  Social Needs  . Financial resource strain: Not on file  . Food insecurity:    Worry: Not on file    Inability: Not on file  . Transportation needs:    Medical: Not on file    Non-medical: Not on file  Tobacco Use  . Smoking status: Never Smoker  . Smokeless tobacco: Never Used  Substance and Sexual Activity  . Alcohol use: Yes    Comment: occas  . Drug use: Never  . Sexual activity: Yes    Birth control/protection: None  Lifestyle  . Physical activity:    Days per week: Not on file    Minutes per session: Not on file  . Stress: Not on file  Relationships  . Social connections:  Talks on phone: Not on file    Gets together: Not on file    Attends religious service: Not on file    Active member of club or organization: Not on file    Attends meetings of clubs or organizations: Not on file    Relationship status: Not on file  . Intimate partner violence:    Fear of current or ex partner: Not on file    Emotionally abused: Not on file    Physically abused: Not on file    Forced sexual activity: Not on file  Other Topics Concern  . Not on file  Social History Narrative  . Not on file      Family History  Problem Relation Age of Onset  . Colon polyps Father [id number]  . Colon cancer Neg Hx      Review of Systems  Constitutional: Negative for fever and unexpected weight change.  HENT: Positive for sore throat.  Negative for congestion, dental problem, ear pain, nosebleeds, postnasal drip, rhinorrhea, sinus pressure, sneezing and trouble swallowing.   Eyes: Negative for redness and itching.  Respiratory: Positive for shortness of breath. Negative for cough, chest tightness and wheezing.   Cardiovascular: Negative for palpitations and leg swelling.  Gastrointestinal: Negative for nausea and vomiting.  Genitourinary: Negative for dysuria.  Musculoskeletal: Negative for joint swelling.  Skin: Negative for rash.  Allergic/Immunologic: Negative.  Negative for environmental allergies, food allergies and immunocompromised state.  Neurological: Negative for headaches.  Hematological: Does not bruise/bleed easily.  Psychiatric/Behavioral: Negative for dysphoric mood. The patient is not nervous/anxious.        Objective:   Physical Exam  Gen. Pleasant, obese, in no distress, normal affect ENT - no lesions, no post nasal drip, class 2-3 airway Neck: No JVD, no thyromegaly, no carotid bruits Lungs: no use of accessory muscles, no dullness to percussion, decreased without rales or rhonchi  Cardiovascular: Rhythm regular, heart sounds  normal, no murmurs or gallops, no peripheral edema Abdomen: soft and non-tender, no hepatosplenomegaly, BS normal. Musculoskeletal: No deformities, no cyanosis or clubbing Neuro:  alert, non focal, no tremors       Assessment & Plan:

## 2018-12-07 NOTE — Patient Instructions (Signed)
Prescription for CPAP 11 cm with nasal pillows will be sent to DME. You have severe OSA and expectation is that you use your machine at least 6 hours every night

## 2018-12-07 NOTE — Assessment & Plan Note (Signed)
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  She is able to adjust based on her work requirements and I do not think that we have to try to change this

## 2018-12-07 NOTE — Assessment & Plan Note (Signed)
We reviewed her sleep study in detail which shows severe OSA with severe oxygen desaturations and sleep disruption.  This was corrected by CPAP of 11 cm with nasal pillows.  She had significant rebound and REM sleep and tolerated this quite well other than the first night effect.  Prescription for CPAP 11 cm with nasal pillows will be sent to DME Weight loss encouraged, compliance with goal of at least 4-6 hrs every night is the expectation. Advised against medications with sedative side effects Cautioned against driving when sleepy - understanding that sleepiness will vary on a day to day basis

## 2018-12-08 NOTE — Addendum Note (Signed)
Addended by: Maurene CapesPOTTS, Yared Barefoot M on: 12/08/2018 08:52 AM   Modules accepted: Orders

## 2019-11-22 ENCOUNTER — Other Ambulatory Visit: Payer: Self-pay | Admitting: Orthopedic Surgery

## 2019-11-22 DIAGNOSIS — M25512 Pain in left shoulder: Secondary | ICD-10-CM

## 2020-02-14 ENCOUNTER — Ambulatory Visit: Payer: BC Managed Care – PPO | Attending: Internal Medicine

## 2020-02-14 ENCOUNTER — Other Ambulatory Visit: Payer: Self-pay

## 2020-02-14 DIAGNOSIS — Z20822 Contact with and (suspected) exposure to covid-19: Secondary | ICD-10-CM

## 2020-02-16 ENCOUNTER — Encounter (HOSPITAL_COMMUNITY): Payer: Self-pay | Admitting: Emergency Medicine

## 2020-02-16 ENCOUNTER — Inpatient Hospital Stay (HOSPITAL_COMMUNITY)
Admission: EM | Admit: 2020-02-16 | Discharge: 2020-02-18 | DRG: 177 | Disposition: A | Discharge status: Home / Self Care | Payer: BC Managed Care – PPO | Attending: Internal Medicine | Admitting: Internal Medicine

## 2020-02-16 ENCOUNTER — Emergency Department (HOSPITAL_COMMUNITY): Payer: BC Managed Care – PPO

## 2020-02-16 ENCOUNTER — Other Ambulatory Visit: Payer: Self-pay

## 2020-02-16 DIAGNOSIS — Z79899 Other long term (current) drug therapy: Secondary | ICD-10-CM

## 2020-02-16 DIAGNOSIS — Z6841 Body Mass Index (BMI) 40.0 and over, adult: Secondary | ICD-10-CM | POA: Diagnosis not present

## 2020-02-16 DIAGNOSIS — G4733 Obstructive sleep apnea (adult) (pediatric): Secondary | ICD-10-CM | POA: Diagnosis present

## 2020-02-16 DIAGNOSIS — M1990 Unspecified osteoarthritis, unspecified site: Secondary | ICD-10-CM | POA: Diagnosis present

## 2020-02-16 DIAGNOSIS — U071 COVID-19: Secondary | ICD-10-CM | POA: Diagnosis present

## 2020-02-16 DIAGNOSIS — J1282 Pneumonia due to coronavirus disease 2019: Secondary | ICD-10-CM | POA: Diagnosis present

## 2020-02-16 DIAGNOSIS — J9601 Acute respiratory failure with hypoxia: Secondary | ICD-10-CM | POA: Diagnosis present

## 2020-02-16 LAB — CBC WITH DIFFERENTIAL/PLATELET
Abs Immature Granulocytes: 0.01 10*3/uL (ref 0.00–0.07)
Basophils Absolute: 0 10*3/uL (ref 0.0–0.1)
Basophils Relative: 0 %
Eosinophils Absolute: 0.1 10*3/uL (ref 0.0–0.5)
Eosinophils Relative: 1 %
HCT: 43 % (ref 36.0–46.0)
Hemoglobin: 13.4 g/dL (ref 12.0–15.0)
Immature Granulocytes: 0 %
Lymphocytes Relative: 30 %
Lymphs Abs: 1.1 10*3/uL (ref 0.7–4.0)
MCH: 27.8 pg (ref 26.0–34.0)
MCHC: 31.2 g/dL (ref 30.0–36.0)
MCV: 89.2 fL (ref 80.0–100.0)
Monocytes Absolute: 0.3 10*3/uL (ref 0.1–1.0)
Monocytes Relative: 7 %
Neutro Abs: 2.3 10*3/uL (ref 1.7–7.7)
Neutrophils Relative %: 62 %
Platelets: 254 10*3/uL (ref 150–400)
RBC: 4.82 MIL/uL (ref 3.87–5.11)
RDW: 13.5 % (ref 11.5–15.5)
WBC: 3.8 10*3/uL — ABNORMAL LOW (ref 4.0–10.5)
nRBC: 0 % (ref 0.0–0.2)

## 2020-02-16 LAB — COMPREHENSIVE METABOLIC PANEL
ALT: 24 U/L (ref 0–44)
AST: 26 U/L (ref 15–41)
Albumin: 3.6 g/dL (ref 3.5–5.0)
Alkaline Phosphatase: 54 U/L (ref 38–126)
Anion gap: 11 (ref 5–15)
BUN: 10 mg/dL (ref 6–20)
CO2: 26 mmol/L (ref 22–32)
Calcium: 8.6 mg/dL — ABNORMAL LOW (ref 8.9–10.3)
Chloride: 101 mmol/L (ref 98–111)
Creatinine, Ser: 0.8 mg/dL (ref 0.44–1.00)
GFR calc Af Amer: >60 mL/min (ref >60)
GFR calc non Af Amer: >60 mL/min (ref >60)
Glucose, Bld: 128 mg/dL — ABNORMAL HIGH (ref 70–99)
Potassium: 3.6 mmol/L (ref 3.5–5.1)
Sodium: 138 mmol/L (ref 135–145)
Total Bilirubin: 0.6 mg/dL (ref 0.3–1.2)
Total Protein: 7.3 g/dL (ref 6.5–8.1)

## 2020-02-16 LAB — LACTATE DEHYDROGENASE: LDH: 227 U/L — ABNORMAL HIGH (ref 98–192)

## 2020-02-16 LAB — FERRITIN: Ferritin: 122 ng/mL (ref 11–307)

## 2020-02-16 LAB — NOVEL CORONAVIRUS, NAA: SARS-CoV-2, NAA: DETECTED — AB

## 2020-02-16 LAB — D-DIMER, QUANTITATIVE: D-Dimer, Quant: 0.87 ug/mL-FEU — ABNORMAL HIGH (ref 0.00–0.50)

## 2020-02-16 LAB — ABO/RH: ABO/RH(D): A POS

## 2020-02-16 LAB — FIBRINOGEN: Fibrinogen: 486 mg/dL — ABNORMAL HIGH (ref 210–475)

## 2020-02-16 LAB — TRIGLYCERIDES: Triglycerides: 109 mg/dL (ref <150)

## 2020-02-16 LAB — LACTIC ACID, PLASMA: Lactic Acid, Venous: 1.9 mmol/L (ref 0.5–1.9)

## 2020-02-16 LAB — PROCALCITONIN: Procalcitonin: <0.1 ng/mL

## 2020-02-16 LAB — C-REACTIVE PROTEIN: CRP: 2.8 mg/dL — ABNORMAL HIGH (ref <1.0)

## 2020-02-16 MED ORDER — POTASSIUM CHLORIDE CRYS ER 20 MEQ PO TBCR
40.0000 meq | EXTENDED_RELEASE_TABLET | Freq: Once | ORAL | Status: AC
  Administered 2020-02-16: 40 meq via ORAL
  Filled 2020-02-16: qty 2

## 2020-02-16 MED ORDER — ENOXAPARIN SODIUM 80 MG/0.8ML ~~LOC~~ SOLN
75.0000 mg | SUBCUTANEOUS | Status: DC
  Administered 2020-02-16 – 2020-02-17 (×2): 75 mg via SUBCUTANEOUS
  Filled 2020-02-16 (×2): qty 0.8

## 2020-02-16 MED ORDER — ONDANSETRON HCL 4 MG/2ML IJ SOLN: 4.0000 mg | Freq: Four times a day (QID) | INTRAMUSCULAR | Status: DC | PRN

## 2020-02-16 MED ORDER — ONDANSETRON HCL 4 MG PO TABS: 4.0000 mg | ORAL_TABLET | Freq: Four times a day (QID) | ORAL | Status: DC | PRN

## 2020-02-16 MED ORDER — SODIUM CHLORIDE 0.9 % IV SOLN
200.0000 mg | Freq: Once | INTRAVENOUS | Status: AC
  Administered 2020-02-16: 200 mg via INTRAVENOUS
  Filled 2020-02-16: qty 40

## 2020-02-16 MED ORDER — SODIUM CHLORIDE 0.9 % IV SOLN
100.0000 mg | Freq: Every day | INTRAVENOUS | Status: DC
  Administered 2020-02-17 – 2020-02-18 (×2): 100 mg via INTRAVENOUS
  Filled 2020-02-16 (×3): qty 20

## 2020-02-16 MED ORDER — ALBUTEROL SULFATE HFA 108 (90 BASE) MCG/ACT IN AERS
1.0000 | INHALATION_SPRAY | RESPIRATORY_TRACT | Status: DC | PRN
  Filled 2020-02-16: qty 6.7

## 2020-02-16 MED ORDER — ASCORBIC ACID 500 MG PO TABS
500.0000 mg | ORAL_TABLET | Freq: Every day | ORAL | Status: DC
  Administered 2020-02-16 – 2020-02-18 (×3): 500 mg via ORAL
  Filled 2020-02-16 (×3): qty 1

## 2020-02-16 MED ORDER — DEXAMETHASONE SODIUM PHOSPHATE 10 MG/ML IJ SOLN
10.0000 mg | Freq: Once | INTRAMUSCULAR | Status: AC
  Administered 2020-02-16: 10 mg via INTRAVENOUS
  Filled 2020-02-16: qty 1

## 2020-02-16 MED ORDER — GUAIFENESIN-DM 100-10 MG/5ML PO SYRP: 10.0000 mL | ORAL_SOLUTION | ORAL | Status: DC | PRN

## 2020-02-16 MED ORDER — ZINC SULFATE 220 (50 ZN) MG PO CAPS
220.0000 mg | ORAL_CAPSULE | Freq: Every day | ORAL | Status: DC
  Administered 2020-02-16 – 2020-02-18 (×3): 220 mg via ORAL
  Filled 2020-02-16 (×3): qty 1

## 2020-02-16 MED ORDER — ACETAMINOPHEN 325 MG PO TABS: 650.0000 mg | ORAL_TABLET | Freq: Four times a day (QID) | ORAL | Status: DC | PRN

## 2020-02-16 MED ORDER — POLYETHYLENE GLYCOL 3350 17 G PO PACK: 17.0000 g | PACK | Freq: Every day | ORAL | Status: DC | PRN

## 2020-02-16 MED ORDER — DEXAMETHASONE SODIUM PHOSPHATE 10 MG/ML IJ SOLN
6.0000 mg | INTRAMUSCULAR | Status: DC
  Administered 2020-02-17 – 2020-02-18 (×2): 6 mg via INTRAVENOUS
  Filled 2020-02-16 (×2): qty 1

## 2020-02-16 NOTE — ED Notes (Signed)
Pulse oximetry at 88% with walking, stepping in room.

## 2020-02-16 NOTE — Progress Notes (Signed)
RT contacted by RN per CPAP order for patient. Per protocol patient unable to use CPAP at this time due to being +COVID. RT informed RN she may need to place patient on O2 to prevent desat episodes throughout the night.

## 2020-02-16 NOTE — Plan of Care (Signed)

## 2020-02-16 NOTE — H&P (Signed)
History and Physical    Susan Robertson WCB:[id number] DOB: February 27, 1966 DOA: 02/16/2020  PCP: Carylon Perches, MD   Patient coming from: Home  I have personally briefly reviewed patient's old medical records in [facility]  Chief Complaint: SOB, cough  HPI: Susan Robertson is a 54 y.o. female with medical history significant for obstructive sleep apnea.  Patient presented to the ED with complaints of difficulty breathing and cough.  Patient is a Statistician.  She used her portable pulse oximeter at home and reports that her O2 sats dropped to 75%-80s with at home while she was walking. Had a Covid test 02/14/2020, that resulted positive.  She started feeling unwell about 5 days ago.  T-max recorded at home was 100.8.  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  ED Course: O2 sats 94%, dropped to 88% with ambulation.  WBC 3.8.  Elevation in some inflammatory markers D-dimer, LDH, fibrinogen.  Portable chest x-ray -subtle hazy opacities peripheral aspect of the mid lung fields bilaterally suspicious for early viral pneumonia. Hospitalist to admit for COVID-19 pneumonia.  Review of Systems: As per HPI all other systems reviewed and negative.  Past Medical History:  Diagnosis Date  . Arthritis   . Joint pain   . Sleep apnea    in process of getting CPAP.    Past Surgical History:  Procedure Laterality Date  . COLONOSCOPY WITH PROPOFOL N/A 11/05/2018   Procedure: COLONOSCOPY WITH PROPOFOL;  Surgeon: Corbin Ade, MD;  [facility];  Service: Endoscopy;  Laterality: N/A;  1:30pm - pt knows to arrive at 8:00  . ingrown toenail Bilateral    Great toes  . KNEE ARTHROSCOPY Left 2014  . WISDOM TOOTH EXTRACTION       reports that she has never smoked. She has never used smokeless tobacco. She reports current alcohol use. She reports that she does not use drugs.  No Known  Allergies  Family History  Problem Relation Age of Onset  . Colon polyps Father 78  . Colon cancer Neg Hx     Prior to Admission medications   Medication Sig Start Date End Date Taking? Authorizing Provider  acetaminophen (TYLENOL) 500 MG tablet Take 500 mg by mouth every 6 (six) hours as needed.   Yes [provider]  ibuprofen (ADVIL) 200 MG tablet Take 600-800 mg by mouth every 6 (six) hours as needed for fever or moderate pain.   Yes [provider]  pseudoephedrine (SUDAFED) 120 MG 12 hr tablet Take 120 mg by mouth every 12 (twelve) hours as needed for congestion.   Yes [provider]  Pseudoephedrine-Ibuprofen (ADVIL COLD/SINUS) 30-200 MG TABS Take 1 tablet by mouth every 6 (six) hours as needed.   Yes [provider]    Physical Exam: Vitals:   02/16/20 1332 02/16/20 1333  BP: (!) 142/43   Pulse: 92   Resp: 20   Temp: 98.3 F (36.8 C)   TempSrc: Oral   SpO2: 94%   Weight:  (!) 150.6 kg  Height:  5\' 7"  (1.702 m)    Constitutional: NAD, calm, comfortable Vitals:   02/16/20 1332 02/16/20 1333  BP: (!) 142/43   Pulse: 92   Resp: 20   Temp: 98.3 F (36.8 C)   TempSrc: Oral   SpO2: 94%   Weight:  (!) 150.6 kg  Height:  5\' 7"  (1.702 m)  Eyes:  lids and conjunctivae normal ENMT: Mucous membranes are moist Neck: normal, supple, no masses, no thyromegaly Respiratory: Habitus limiting auscultation nevertheless clear to auscultation bilaterally, no wheezing, no crackles. Normal respiratory effort. No accessory muscle use.  Cardiovascular: Habitus limiting auscultation, but regular rate and rhythm, no appreciable murmurs / rubs / gallops.  Trace extremity edema. 2+ pedal pulses.  Abdomen: no tenderness, no masses palpated. No hepatosplenomegaly. Bowel sounds positive.  Musculoskeletal: no clubbing / cyanosis. No joint deformity upper and lower extremities. Good ROM, no contractures.  Skin: no rashes, lesions, ulcers. No  induration Neurologic: No gross cranial nerve abnormality, moving all extremities spontaneously.  Psychiatric: Normal judgment and insight. Alert and oriented x 3. Normal mood.   Labs on Admission: I have personally reviewed following labs and imaging studies  CBC: Recent Labs  Lab 02/16/20 1532  WBC 3.8*  NEUTROABS 2.3  HGB 13.4  HCT 43.0  MCV 89.2  PLT 967   Basic Metabolic Panel: Recent Labs  Lab 02/16/20 1532  NA 138  K 3.6  CL 101  CO2 26  GLUCOSE 128*  BUN 10  CREATININE 0.80  CALCIUM 8.6*   Liver Function Tests: Recent Labs  Lab 02/16/20 1532  AST 26  ALT 24  ALKPHOS 54  BILITOT 0.6  PROT 7.3  ALBUMIN 3.6   Lipid Profile: Recent Labs    02/16/20 1533  TRIG 109    Radiological Exams on Admission: DG Chest Portable 1 View  Result Date: 02/16/2020 CLINICAL DATA:  Hypoxia, COVID-19 positive EXAM: PORTABLE CHEST 1 VIEW COMPARISON:  None. FINDINGS: The heart size and mediastinal contours are within normal limits. Subtle hazy opacities within the peripheral aspects of the mid lung fields bilaterally. No pleural effusion or pneumothorax. The visualized skeletal structures are unremarkable. IMPRESSION: Subtle hazy opacities within the peripheral aspects of the mid lung fields bilaterally. Findings suspicious for early viral pneumonia. Electronically Signed   By: Davina Poke D.O.   On: 02/16/2020 14:29    EKG: Independently reviewed.,  Artifacts present.  Sinus rhythm.  QTC 451.  No old EKG to compare  Assessment/Plan Active Problems:   Pneumonia due to COVID-19 virus    Pneumonia due to COVID-19 virus with acute respiratory failure- O2 sats down to 88% with ambulation in the ED, 84% at rest.  Procalcitonin less than 0.1.  WBC 3.8.  Chest x-ray consistent with early viral pneumonia.  Elevation in some inflammatory markers D-dimer 0.8, LDH 227, fibrinogen 486. -Dexamethasone 10 mg x 1 continue 6 mg daily -IV remdesivir pharmacy to dose -BMP, CBC  daily -Daily inflammatory markers -Flutter valve, incentive spirometry -Multivitamins, -Mucolytics, albuterol inhaler, supplemental O2 PRN -COVID-19 admission protocol -Follow blood cultures obtained in ED  Obstructive sleep apnea-compliant with CPAP at home -CPAP on hold for now with COVID-19 infection.   DVT prophylaxis: Lovenox Code Status: Full code Family Communication: None at bedside Disposition Plan: > 2 days, pending improvement in respiratory status. Consults called: none Admission status: Inpatient, telemetry  I certify that at the point of admission it is my clinical judgment that the patient will require inpatient hospital care spanning beyond 2 midnights from the point of admission due to high intensity of service, high risk for further deterioration and high frequency of surveillance required.    Bethena Roys MD [facility]  02/16/2020, 4:49 PM

## 2020-02-16 NOTE — ED Provider Notes (Signed)
[facility] EMERGENCY DEPARTMENT Provider Note   CSN: [id number] Arrival date & time: 02/16/20  1312     History Chief Complaint  Patient presents with  . Shortness of Breath    COVID +    Susan Robertson is a 54 y.o. female.  HPI      Susan Robertson is a 54 y.o. female with history of sleep apnea and wears a CPAP who presents to the Emergency Department complaining of shortness of breath with exertion and recent COVID-19 positive test.  She states that her symptoms began 5 days ago with nasal congestion and occasional cough.  She states she developed a low-grade fever (100.8) 2 days ago and had a Covid test.  Today, she states she was notified that her Covid test was positive.  She has been checking her oxygen level at home with a portable pulse oximeter and is noted that her oxygen levels have been in the 80s with exertion or activity.  She states that her oxygen dropped as low as 75% yesterday.  She has been wearing her CPAP at home and taking Tylenol.  She denies chest pain .  No fever today.  She contacted her PCPs office and was advised to come to the emergency department for further evaluation.  She denies abdominal pain, vomiting, diarrhea, loss of taste or smell.   Past Medical History:  Diagnosis Date  . Arthritis   . Joint pain   . Sleep apnea    in process of getting CPAP.    Patient Active Problem List   Diagnosis Date Noted  . OSA (obstructive sleep apnea) 12/07/2018  . 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 Encounter for screening colonoscopy 10/14/2018    Past Surgical History:  Procedure Laterality Date  . COLONOSCOPY WITH PROPOFOL N/A 11/05/2018   Procedure: COLONOSCOPY WITH PROPOFOL;  Surgeon: Corbin Ade, MD;  Location: [facility];  Service: Endoscopy;  Laterality: N/A;  1:30pm - pt knows to arrive at 8:00  . ingrown toenail Bilateral    Great toes  . KNEE ARTHROSCOPY Left 2014  . WISDOM TOOTH EXTRACTION       OB History   No obstetric  history on file.     Family History  Problem Relation Age of Onset  . Colon polyps Father 46  . Colon cancer Neg Hx     Social History   Tobacco Use  . Smoking status: Never Smoker  . Smokeless tobacco: Never Used  Substance Use Topics  . Alcohol use: Yes    Comment: occas  . Drug use: Never    Home Medications Prior to Admission medications   Medication Sig Start Date End Date Taking? Authorizing Provider  acetaminophen (TYLENOL) 500 MG tablet Take 500 mg by mouth every 6 (six) hours as needed.   Yes [provider]  ibuprofen (ADVIL) 200 MG tablet Take 600-800 mg by mouth every 6 (six) hours as needed for fever or moderate pain.   Yes [provider]  pseudoephedrine (SUDAFED) 120 MG 12 hr tablet Take 120 mg by mouth every 12 (twelve) hours as needed for congestion.   Yes [provider]  Pseudoephedrine-Ibuprofen (ADVIL COLD/SINUS) 30-200 MG TABS Take 1 tablet by mouth every 6 (six) hours as needed.   Yes [provider]    Allergies    Patient has no known allergies.  Review of Systems   Review of Systems  Constitutional: Negative for appetite change, chills and fever.  HENT: Positive for congestion.  Negative for sore throat and trouble swallowing.   Respiratory: Positive for cough and shortness of breath. Negative for chest tightness and wheezing.   Cardiovascular: Negative for chest pain and leg swelling.  Gastrointestinal: Negative for abdominal pain, nausea and vomiting.  Genitourinary: Negative for dysuria.  Musculoskeletal: Positive for myalgias. Negative for arthralgias.  Skin: Negative for rash.  Neurological: Negative for dizziness, weakness, numbness and headaches.  Hematological: Negative for adenopathy.    Physical Exam Updated Vital Signs BP (!) 142/43 (BP Location: Right Arm)   Pulse 92   Temp 98.3 F (36.8 C) (Oral)   Resp 20   Ht 5\' 7"  (1.702 m)   Wt (!) 150.6 kg   SpO2 94%   BMI 52.00 kg/m   Physical  Exam Vitals and nursing note reviewed.  Constitutional:      Appearance: She is well-developed. She is not ill-appearing or toxic-appearing.  HENT:     Mouth/Throat:     Mouth: Mucous membranes are moist.     Pharynx: No oropharyngeal exudate or posterior oropharyngeal erythema.  Cardiovascular:     Rate and Rhythm: Normal rate and regular rhythm.     Pulses: Normal pulses.  Pulmonary:     Effort: Pulmonary effort is normal.     Breath sounds: No wheezing, rhonchi or rales.  Abdominal:     General: There is no distension.     Palpations: Abdomen is soft.     Tenderness: There is no abdominal tenderness.  Musculoskeletal:        General: Normal range of motion.     Cervical back: Normal range of motion.     Right lower leg: No edema.     Left lower leg: No edema.  Skin:    General: Skin is warm.     Capillary Refill: Capillary refill takes less than 2 seconds.     Findings: No erythema or rash.  Neurological:     General: No focal deficit present.     Mental Status: She is alert.     Sensory: No sensory deficit.     Motor: No weakness.     ED Results / Procedures / Treatments   Labs (all labs ordered are listed, but only abnormal results are displayed) Labs Reviewed  CBC WITH DIFFERENTIAL/PLATELET - Abnormal; Notable for the following components:      Result Value   WBC 3.8 (*)    All other components within normal limits  COMPREHENSIVE METABOLIC PANEL - Abnormal; Notable for the following components:   Glucose, Bld 128 (*)    Calcium 8.6 (*)    All other components within normal limits  D-DIMER, QUANTITATIVE (NOT AT [facility]) - Abnormal; Notable for the following components:   D-Dimer, Quant 0.87 (*)    All other components within normal limits  LACTATE DEHYDROGENASE - Abnormal; Notable for the following components:   LDH 227 (*)    All other components within normal limits  FIBRINOGEN - Abnormal; Notable for the following components:   Fibrinogen 486 (*)    All  other components within normal limits  CULTURE, BLOOD (ROUTINE X 2)  CULTURE, BLOOD (ROUTINE X 2)  LACTIC ACID, PLASMA  PROCALCITONIN  TRIGLYCERIDES  FERRITIN  C-REACTIVE PROTEIN  POC URINE PREG, ED    EKG EKG Interpretation  Date/Time:  Wednesday February 16 2020 15:01:18 EST Ventricular Rate:  82 PR Interval:    QRS Duration: 97 QT Interval:  386 QTC Calculation: 451 R Axis:   88 Text Interpretation: Sinus  rhythm Low voltage, precordial leads Interpretation limited secondary to artifact No old tracing to compare Confirmed by Sherwood Gambler [phone]) on 02/16/2020 3:07:54 PM   Radiology DG Chest Portable 1 View  Result Date: 02/16/2020 CLINICAL DATA:  Hypoxia, COVID-19 positive EXAM: PORTABLE CHEST 1 VIEW COMPARISON:  None. FINDINGS: The heart size and mediastinal contours are within normal limits. Subtle hazy opacities within the peripheral aspects of the mid lung fields bilaterally. No pleural effusion or pneumothorax. The visualized skeletal structures are unremarkable. IMPRESSION: Subtle hazy opacities within the peripheral aspects of the mid lung fields bilaterally. Findings suspicious for early viral pneumonia. Electronically Signed   By: Davina Poke D.O.   On: 02/16/2020 14:29    Procedures Procedures (including critical care time)  Medications Ordered in ED Medications - No data to display  ED Course  I have reviewed the triage vital signs and the nursing notes.  Pertinent labs & imaging results that were available during my care of the patient were reviewed by me and considered in my medical decision making (see chart for details).    MDM Rules/Calculators/A&P                     Patient with confirmed Covid positive test today.  Reports having shortness of breath at home with subjective O2 sat of 75%-88% at home.  Here, patient ambulated in the exam room and O2 sat dropped to 88% on room air.  Chest x-ray is consistent with covid PNA.  Currently, she is not  requiring supplemental oxygen at rest.  I will consult hospitalist for admission due to her hypoxia.  [place] hospitalist, Dr. Denton Brick, and discussed findings.  She agrees to admit     Final Clinical Impression(s) / ED Diagnoses Final diagnoses:  Pneumonia due to COVID-19 virus    Rx / DC Orders ED Discharge Orders    None       Kem Parkinson, PA-C 02/16/20 1651    Maudie Flakes, MD 02/17/20 [phone]

## 2020-02-16 NOTE — ED Triage Notes (Signed)
Patient states Positive COVID test from Monday. Today states shortness of breath with exertion. She states O2 dropped to 75% at home while walking.

## 2020-02-17 DIAGNOSIS — U071 COVID-19: Principal | ICD-10-CM

## 2020-02-17 DIAGNOSIS — J9601 Acute respiratory failure with hypoxia: Secondary | ICD-10-CM

## 2020-02-17 DIAGNOSIS — J1282 Pneumonia due to coronavirus disease 2019: Secondary | ICD-10-CM

## 2020-02-17 DIAGNOSIS — G4733 Obstructive sleep apnea (adult) (pediatric): Secondary | ICD-10-CM

## 2020-02-17 LAB — COMPREHENSIVE METABOLIC PANEL
ALT: 24 U/L (ref 0–44)
AST: 25 U/L (ref 15–41)
Albumin: 3.7 g/dL (ref 3.5–5.0)
Alkaline Phosphatase: 57 U/L (ref 38–126)
Anion gap: 12 (ref 5–15)
BUN: 11 mg/dL (ref 6–20)
CO2: 25 mmol/L (ref 22–32)
Calcium: 8.9 mg/dL (ref 8.9–10.3)
Chloride: 102 mmol/L (ref 98–111)
Creatinine, Ser: 0.68 mg/dL (ref 0.44–1.00)
GFR calc Af Amer: >60 mL/min (ref >60)
GFR calc non Af Amer: >60 mL/min (ref >60)
Glucose, Bld: 143 mg/dL — ABNORMAL HIGH (ref 70–99)
Potassium: 4.2 mmol/L (ref 3.5–5.1)
Sodium: 139 mmol/L (ref 135–145)
Total Bilirubin: 0.4 mg/dL (ref 0.3–1.2)
Total Protein: 7.7 g/dL (ref 6.5–8.1)

## 2020-02-17 LAB — CBC WITH DIFFERENTIAL/PLATELET
Abs Immature Granulocytes: 0.01 10*3/uL (ref 0.00–0.07)
Basophils Absolute: 0 10*3/uL (ref 0.0–0.1)
Basophils Relative: 0 %
Eosinophils Absolute: 0 10*3/uL (ref 0.0–0.5)
Eosinophils Relative: 0 %
HCT: 46.2 % — ABNORMAL HIGH (ref 36.0–46.0)
Hemoglobin: 14.2 g/dL (ref 12.0–15.0)
Immature Granulocytes: 0 %
Lymphocytes Relative: 22 %
Lymphs Abs: 0.6 10*3/uL — ABNORMAL LOW (ref 0.7–4.0)
MCH: 27.6 pg (ref 26.0–34.0)
MCHC: 30.7 g/dL (ref 30.0–36.0)
MCV: 89.7 fL (ref 80.0–100.0)
Monocytes Absolute: 0.1 10*3/uL (ref 0.1–1.0)
Monocytes Relative: 3 %
Neutro Abs: 2.1 10*3/uL (ref 1.7–7.7)
Neutrophils Relative %: 75 %
Platelets: 276 10*3/uL (ref 150–400)
RBC: 5.15 MIL/uL — ABNORMAL HIGH (ref 3.87–5.11)
RDW: 13.4 % (ref 11.5–15.5)
WBC: 2.7 10*3/uL — ABNORMAL LOW (ref 4.0–10.5)
nRBC: 0 % (ref 0.0–0.2)

## 2020-02-17 LAB — PHOSPHORUS: Phosphorus: 3.4 mg/dL (ref 2.5–4.6)

## 2020-02-17 LAB — D-DIMER, QUANTITATIVE: D-Dimer, Quant: 1.01 ug/mL-FEU — ABNORMAL HIGH (ref 0.00–0.50)

## 2020-02-17 LAB — HIV ANTIBODY (ROUTINE TESTING W REFLEX): HIV Screen 4th Generation wRfx: NONREACTIVE

## 2020-02-17 LAB — MAGNESIUM: Magnesium: 2.8 mg/dL — ABNORMAL HIGH (ref 1.7–2.4)

## 2020-02-17 LAB — FERRITIN: Ferritin: 118 ng/mL (ref 11–307)

## 2020-02-17 LAB — C-REACTIVE PROTEIN: CRP: 2.2 mg/dL — ABNORMAL HIGH (ref <1.0)

## 2020-02-17 NOTE — Progress Notes (Signed)
PROGRESS NOTE    Susan Robertson  YPP:[id number] DOB: 1966/01/30 DOA: 02/16/2020 PCP: Asencion Noble, MD    Brief Narrative:  As per H&P written by Dr. Denton Brick on 02/16/2020 54 y.o. female with medical history significant for obstructive sleep apnea.  Patient presented to the ED with complaints of difficulty breathing and cough.  Patient is a Programmer, systems.  She used her portable pulse oximeter at home and reports that her O2 sats dropped to 75%-80s with at home while she was walking. Had a Covid test 02/14/2020, that resulted positive.  She started feeling unwell about 5 days ago.  T-max recorded at home was 100.8.  ED Course: O2 sats 94%, dropped to 88% with ambulation.  WBC 3.8.  Elevation in some inflammatory markers D-dimer, LDH, fibrinogen.  Portable chest x-ray -subtle hazy opacities peripheral aspect of the mid lung fields bilaterally suspicious for early viral pneumonia. Hospitalist to admit for COVID-19 pneumonia.  Assessment & Plan: 1-acute respiratory failure with hypoxia secondary to COVID-19 pneumonia -Chest x-ray demonstrating bilateral atypical changes compatible with early viral pneumonia. -Patient hypoxic especially on exertion into the mid 80s. -Continue treatment with IV remdesivir and IV steroids -Currently afebrile and just expressing intermittent dry cough; mild fatigue and shortness of breath on exertion. -At rest good oxygen saturation on room air appreciated. -Continue to follow inflammatory markers. -Continue zinc, vitamin C and as needed bronchodilators.  2-morbid obesity -Body mass index is 52.73 kg/m. -Low calorie diet, portion control and increase physical activity discussed with patient.  3-OSA (obstructive sleep apnea) -Chronically on CPAP at bedtime -Due to positive Covid infection no CPAP will be administered while in the hospital. -Oxygen supplementation to prevent hypoxia at bedtime will be provided.   DVT prophylaxis: Lovenox Code Status: Full  code Family Communication: No family member at bedside. Disposition Plan: Continue IV remdesivir and Decadron; as needed bronchodilators and the use of flutter valve.  Consultants:   None  Procedures:   See below for x-ray reports  Antimicrobials:  Anti-infectives (From admission, onward)   Start     Dose/Rate Route Frequency Ordered Stop   02/17/20 1000  remdesivir 100 mg in sodium chloride 0.9 % 100 mL IVPB     100 mg 200 mL/hr over 30 Minutes Intravenous Daily 02/16/20 1633 02/21/20 0959   02/16/20 1700  remdesivir 200 mg in sodium chloride 0.9% 250 mL IVPB     200 mg 580 mL/hr over 30 Minutes Intravenous Once 02/16/20 1633 02/16/20 1948      Subjective: Expressing shortness of breath on exertion and intermittent dry coughing spells.  No requiring oxygen supplementation at rest.  Patient is afebrile.  Objective: Vitals:   02/16/20 2041 02/17/20 0317 02/17/20 1027 02/17/20 1625  BP:  136/80 137/75 126/89  Pulse:  71 81 81  Resp:  16  16  Temp:  (!) 97.3 F (36.3 C) 97.8 F (36.6 C) 98.1 F (36.7 C)  TempSrc:  Oral Oral Oral  SpO2: 95% 97% 93% 96%  Weight:  (!) 152.7 kg    Height:        Intake/Output Summary (Last 24 hours) at 02/17/2020 1842 Last data filed at 02/17/2020 1600 Gross per 24 hour  Intake 1400 ml  Output --  Net 1400 ml   Filed Weights   02/16/20 1333 02/17/20 0317  Weight: (!) 150.6 kg (!) 152.7 kg    Examination: General exam: Alert, awake, oriented x 3; still complaining of shortness of breath on exertion and intermittent dry coughing spells.  No need for oxygen supplementation at rest.  Patient currently afebrile. Respiratory system: Positive rhonchi bilaterally; normal respiratory effort.  No using accessory muscle. Cardiovascular system:RRR. No murmurs, rubs, gallops. Gastrointestinal system: Abdomen is obese, nondistended, soft and nontender. No organomegaly or masses felt. Normal bowel sounds heard. Central nervous system: Alert and  oriented. No focal neurological deficits. Extremities: No cyanosis or clubbing. Skin: No rashes, lesions or ulcers Psychiatry: Judgement and insight appear normal. Mood & affect appropriate.     Data Reviewed: I have personally reviewed following labs and imaging studies  CBC: Recent Labs  Lab 02/16/20 1532 02/17/20 0453  WBC 3.8* 2.7*  NEUTROABS 2.3 2.1  HGB 13.4 14.2  HCT 43.0 46.2*  MCV 89.2 89.7  PLT 254 276   Basic Metabolic Panel: Recent Labs  Lab 02/16/20 1532 02/17/20 0453  NA 138 139  K 3.6 4.2  CL 101 102  CO2 26 25  GLUCOSE 128* 143*  BUN 10 11  CREATININE 0.80 0.68  CALCIUM 8.6* 8.9  MG  --  2.8*  PHOS  --  3.4   GFR: Estimated Creatinine Clearance: 125.8 mL/min (by C-G formula based on SCr of 0.68 mg/dL).   Liver Function Tests: Recent Labs  Lab 02/16/20 1532 02/17/20 0453  AST 26 25  ALT 24 24  ALKPHOS 54 57  BILITOT 0.6 0.4  PROT 7.3 7.7  ALBUMIN 3.6 3.7    Lipid Profile: Recent Labs    02/16/20 1533  TRIG 109   Anemia Panel: Recent Labs    02/16/20 1533 02/17/20 0453  FERRITIN 122 118    Recent Results (from the past 240 hour(s))  Novel Coronavirus, NAA ([facility])     Status: Abnormal   Collection Time: 02/14/20  1:11 PM   Specimen: Nasopharyngeal(NP) swabs in vial transport medium   NASOPHARYNGE  TESTING  Result Value Ref Range Status   SARS-CoV-2, NAA Detected (A) Not Detected Final    Comment: This nucleic acid amplification test was developed and its performance characteristics determined by [employer]. Nucleic acid amplification tests include RT-PCR and TMA. This test has not been FDA cleared or approved. This test has been authorized by FDA under an Emergency Use Authorization (EUA). This test is only authorized for the duration of time the declaration that circumstances exist justifying the authorization of the emergency use of in vitro diagnostic tests for detection of SARS-CoV-2 virus and/or  diagnosis of COVID-19 infection under section 564(b)(1) of the Act, 21 U.S.C. 938BOF-7(P) (1), unless the authorization is terminated or revoked sooner. When diagnostic testing is negative, the possibility of a false negative result should be considered in the context of a patient's recent exposures and the presence of clinical signs and symptoms consistent with COVID-19. An individual without symptoms of COVID-19 and who is not shedding SARS-CoV-2 virus wo uld expect to have a negative (not detected) result in this assay.   Blood Culture (routine x 2)     Status: None (Preliminary result)   Collection Time: 02/16/20  3:32 PM   Specimen: Right Antecubital; Blood  Result Value Ref Range Status   Specimen Description RIGHT ANTECUBITAL  Final   Special Requests   Final    BOTTLES DRAWN AEROBIC AND ANAEROBIC Blood Culture adequate volume   Culture   Final    NO GROWTH < 12 HOURS Performed at [facility], [address] [place], [place] [address]    Report Status PENDING  Incomplete  Blood Culture (routine x 2)  Status: None (Preliminary result)   Collection Time: 02/16/20  3:33 PM   Specimen: Right Antecubital; Blood  Result Value Ref Range Status   Specimen Description RIGHT ANTECUBITAL  Final   Special Requests   Final    BOTTLES DRAWN AEROBIC AND ANAEROBIC Blood Culture adequate volume   Culture   Final    NO GROWTH < 12 HOURS Performed at [facility], [address] [place], [place] [address]    Report Status PENDING  Incomplete      Radiology Studies: DG Chest Portable 1 View  Result Date: 02/16/2020 CLINICAL DATA:  Hypoxia, COVID-19 positive EXAM: PORTABLE CHEST 1 VIEW COMPARISON:  None. FINDINGS: The heart size and mediastinal contours are within normal limits. Subtle hazy opacities within the peripheral aspects of the mid lung fields bilaterally. No pleural effusion or pneumothorax. The visualized skeletal structures are unremarkable. IMPRESSION: Subtle hazy  opacities within the peripheral aspects of the mid lung fields bilaterally. Findings suspicious for early viral pneumonia. Electronically Signed   By: Duanne Guess D.O.   On: 02/16/2020 14:29        Scheduled Meds: . vitamin C  500 mg Oral Daily  . dexamethasone (DECADRON) injection  6 mg Intravenous Q24H  . enoxaparin (LOVENOX) injection  75 mg Subcutaneous Q24H  . zinc sulfate  220 mg Oral Daily   Continuous Infusions: . remdesivir 100 mg in NS 100 mL 100 mg (02/17/20 1146)     LOS: 1 day    Time spent: 30 minutes.   Vassie Loll, MD [facility] Pager [phone]   02/17/2020, 6:42 PM

## 2020-02-17 NOTE — Plan of Care (Signed)

## 2020-02-17 NOTE — Progress Notes (Signed)
Patient has CPAP order in but per protocol is unable to wear at this time due to being COVID+.

## 2020-02-18 DIAGNOSIS — E66813 Obesity, class 3: Secondary | ICD-10-CM

## 2020-02-18 LAB — CBC WITH DIFFERENTIAL/PLATELET
Abs Immature Granulocytes: 0.03 10*3/uL (ref 0.00–0.07)
Basophils Absolute: 0 10*3/uL (ref 0.0–0.1)
Basophils Relative: 0 %
Eosinophils Absolute: 0 10*3/uL (ref 0.0–0.5)
Eosinophils Relative: 0 %
HCT: 44.1 % (ref 36.0–46.0)
Hemoglobin: 13.3 g/dL (ref 12.0–15.0)
Immature Granulocytes: 0 %
Lymphocytes Relative: 21 %
Lymphs Abs: 1.4 10*3/uL (ref 0.7–4.0)
MCH: 27.4 pg (ref 26.0–34.0)
MCHC: 30.2 g/dL (ref 30.0–36.0)
MCV: 90.7 fL (ref 80.0–100.0)
Monocytes Absolute: 0.4 10*3/uL (ref 0.1–1.0)
Monocytes Relative: 6 %
Neutro Abs: 4.9 10*3/uL (ref 1.7–7.7)
Neutrophils Relative %: 73 %
Platelets: 246 10*3/uL (ref 150–400)
RBC: 4.86 MIL/uL (ref 3.87–5.11)
RDW: 13.5 % (ref 11.5–15.5)
WBC: 6.8 10*3/uL (ref 4.0–10.5)
nRBC: 0 % (ref 0.0–0.2)

## 2020-02-18 LAB — COMPREHENSIVE METABOLIC PANEL
ALT: 20 U/L (ref 0–44)
AST: 25 U/L (ref 15–41)
Albumin: 3.5 g/dL (ref 3.5–5.0)
Alkaline Phosphatase: 49 U/L (ref 38–126)
Anion gap: 11 (ref 5–15)
BUN: 17 mg/dL (ref 6–20)
CO2: 23 mmol/L (ref 22–32)
Calcium: 8.6 mg/dL — ABNORMAL LOW (ref 8.9–10.3)
Chloride: 104 mmol/L (ref 98–111)
Creatinine, Ser: 0.86 mg/dL (ref 0.44–1.00)
GFR calc Af Amer: >60 mL/min (ref >60)
GFR calc non Af Amer: >60 mL/min (ref >60)
Glucose, Bld: 131 mg/dL — ABNORMAL HIGH (ref 70–99)
Potassium: 5.1 mmol/L (ref 3.5–5.1)
Sodium: 138 mmol/L (ref 135–145)
Total Bilirubin: 0.9 mg/dL (ref 0.3–1.2)
Total Protein: 6.8 g/dL (ref 6.5–8.1)

## 2020-02-18 LAB — PHOSPHORUS: Phosphorus: 4.2 mg/dL (ref 2.5–4.6)

## 2020-02-18 LAB — MAGNESIUM: Magnesium: 2.8 mg/dL — ABNORMAL HIGH (ref 1.7–2.4)

## 2020-02-18 LAB — C-REACTIVE PROTEIN: CRP: 0.7 mg/dL (ref <1.0)

## 2020-02-18 LAB — FERRITIN: Ferritin: 100 ng/mL (ref 11–307)

## 2020-02-18 LAB — D-DIMER, QUANTITATIVE: D-Dimer, Quant: 0.76 ug/mL-FEU — ABNORMAL HIGH (ref 0.00–0.50)

## 2020-02-18 MED ORDER — ASCORBIC ACID 500 MG PO TABS: 500.0000 mg | ORAL_TABLET | Freq: Every day | ORAL | 1 refills | Status: DC

## 2020-02-18 MED ORDER — DEXAMETHASONE 6 MG PO TABS: 6.0000 mg | ORAL_TABLET | Freq: Every day | ORAL | 0 refills | Status: AC

## 2020-02-18 MED ORDER — OMEPRAZOLE 20 MG PO CPDR: 20.0000 mg | DELAYED_RELEASE_CAPSULE | Freq: Every day | ORAL | 0 refills | Status: DC

## 2020-02-18 MED ORDER — DEXAMETHASONE 6 MG PO TABS: 6.0000 mg | ORAL_TABLET | Freq: Every day | ORAL | 0 refills | Status: DC

## 2020-02-18 MED ORDER — ZINC SULFATE 220 (50 ZN) MG PO CAPS: 220.0000 mg | ORAL_CAPSULE | Freq: Every day | ORAL | 1 refills | Status: DC

## 2020-02-18 NOTE — TOC Progression Note (Signed)
Transition of Care [facility]) - Progression Note    Patient Details  Name: Susan Robertson MRN: [id number] Date of Birth: Oct 01, 1966  [facility]) CM/SW Contact  Leitha Bleak, RN Phone Number: 02/18/2020, 12:23 PM  Clinical Narrative:  Review plan with patient for get infusion at [facility] on 2/20 and 2/21.  Patient understand. Instruction has been added to AVS. Patient states she will drive herself to the appointments.

## 2020-02-18 NOTE — Discharge Instructions (Signed)
You are scheduled for an outpatient infusion of Remdesivir at 11:30 AM on Saturday 2/20 and Sunday 2/21.  Please report to [facility] at [address].  Drive to the security guard and tell them you are here for an infusion. They will direct you to the front entrance where we will come and get you.  For questions call [phone].  Thanks

## 2020-02-18 NOTE — Discharge Summary (Signed)
Physician Discharge Summary  Susan Robertson IWP:[id number] DOB: 01-12-1966 DOA: 02/16/2020  PCP: Carylon Perches, MD  Admit date: 02/16/2020 Discharge date: 02/18/2020  Time spent: 35 minutes  Recommendations for Outpatient Follow-up:  1. Repeat BMET to follow electrolytes and renal function.   Discharge Diagnoses:  Active Problems:   OSA (obstructive sleep apnea)   Pneumonia due to COVID-19 virus   Obesity, Class III, BMI 40-49.9 (morbid obesity) (HCC)   Discharge Condition: Stable and improved.  Patient discharged home with instructions for outpatient remdesivir infusion and continue use of the steroids.  Follow-up with PCP in 10 days.  Diet recommendation: low calorie diet.  Filed Weights   02/16/20 1333 02/17/20 0317  Weight: (!) 150.6 kg (!) 152.7 kg    History of present illness:  As per H&P written by Dr. Mariea Clonts on 02/16/2020 54 y.o.femalewith medical history significant forobstructive sleep apnea.Patient presented to the ED with complaints of difficulty breathing and cough. Patient is a Statistician. She used her portable pulse oximeter at home andreports that her O2 sats dropped to 75%-80s with at home while she was walking. Had a Covid test 02/14/2020,that resulted positive. She started feeling unwell about 5 days ago. T-max recorded at home was 100.8.  ED Course:O2 sats 94%, dropped to 88% with ambulation. WBC 3.8. Elevation in some inflammatory markers D-dimer, LDH, fibrinogen.Portable chest x-ray -subtle hazy opacities peripheral aspect of the mid lung fields bilaterally suspicious for early viral pneumonia. Hospitalist to admit for COVID-19 pneumonia.  Hospital Course:  1-acute respiratory failure with hypoxia secondary to COVID-19 pneumonia -Chest x-ray demonstrating bilateral atypical changes compatible with early viral pneumonia. -Good oxygen saturation on room air prior to discharge appreciated. -Continue treatment with IV remdesivir (3 doses given  inside the hospitala nd 2 more doses arranged as an outpatient; will also complete 7 more days of oral decadron. -Currently afebrile and just expressing intermittent dry cough; mild fatigue and shortness of breath on exertion (which is overall significantly improved). -stable inflammatory markers. -Continue zinc, vitamin C and as needed bronchodilators.  2-morbid obesity -Body mass index is 52.73 kg/m. -Low calorie diet, portion control and increase physical activity discussed with patient. -outpatient bariatric clinic referral recommended.   3-OSA (obstructive sleep apnea) -Chronically on CPAP at bedtime; which she has been encouraged to continue after discharge.  4-GI prophylaxis -Will use Prilosec Daily  Procedures:  See below for x-ray reports.  Consultations:  None  Discharge Exam: Vitals:   02/18/20 0607 02/18/20 0801  BP: (!) 97/59 107/60  Pulse: 67 60  Resp: 20   Temp: (!) 97.5 F (36.4 C) 97.9 F (36.6 C)  SpO2: 100% 100%    General: Alert, awake and oriented x3; reports improvement in her breathing and feeling very short of breath.  Ventilatory settings to maintain dry coughing spells reported patient is afebrile and performing her activities inside the room without assistance.  Oxygen saturation mid 90s to 100% at rest. Respiratory system: Positive rhonchi bilaterally; normal respiratory effort.  No using accessory muscle. Cardiovascular system:RRR. No murmurs, rubs, gallops. Gastrointestinal system: Abdomen is obese, nondistended, soft and nontender. No organomegaly or masses felt. Normal bowel sounds heard. Central nervous system: Alert and oriented. No focal neurological deficits. Extremities: No cyanosis or clubbing. Skin: No rashes, lesions or ulcers Psychiatry: Judgement and insight appear normal. Mood & affect appropriate.    Discharge Instructions   Discharge Instructions    Diet - low sodium heart healthy   Complete by: As directed     Discharge  instructions   Complete by: As directed    Take medications as prescribed Maintain Adequate hydration Follow-up with PCP in 10 days Follow low calorie diet and portion control.     Allergies as of 02/18/2020   No Known Allergies     Medication List    STOP taking these medications   Advil Cold/Sinus 30-200 MG Tabs Generic drug: Pseudoephedrine-Ibuprofen   ibuprofen 200 MG tablet Commonly known as: ADVIL     TAKE these medications   acetaminophen 500 MG tablet Commonly known as: TYLENOL Take 500 mg by mouth every 6 (six) hours as needed.   ascorbic acid 500 MG tablet Commonly known as: VITAMIN C Take 1 tablet (500 mg total) by mouth daily. Start taking on: February 19, 2020   dexamethasone 6 MG tablet Commonly known as: DECADRON Take 1 tablet (6 mg total) by mouth daily for 7 days.   omeprazole 20 MG capsule Commonly known as: PriLOSEC Take 1 capsule (20 mg total) by mouth daily.   pseudoephedrine 120 MG 12 hr tablet Commonly known as: SUDAFED Take 120 mg by mouth every 12 (twelve) hours as needed for congestion.   zinc sulfate 220 (50 Zn) MG capsule Take 1 capsule (220 mg total) by mouth daily. Start taking on: February 19, 2020      No Known Allergies Follow-up Information    [facility]. Go to.   Why: 2/20 and 2/21  see below for instructions Contact information: [address] [place] [address] [phone]       Carylon Perches, MD. Schedule an appointment as soon as possible for a visit in 10 day(s).   Specialty: Internal Medicine Contact information: [address] [place] [address] [phone]           The results of significant diagnostics from this hospitalization (including imaging, microbiology, ancillary and laboratory) are listed below for reference.    Significant Diagnostic Studies: DG Chest Portable 1 View  Result Date: 02/16/2020 CLINICAL DATA:  Hypoxia,  COVID-19 positive EXAM: PORTABLE CHEST 1 VIEW COMPARISON:  None. FINDINGS: The heart size and mediastinal contours are within normal limits. Subtle hazy opacities within the peripheral aspects of the mid lung fields bilaterally. No pleural effusion or pneumothorax. The visualized skeletal structures are unremarkable. IMPRESSION: Subtle hazy opacities within the peripheral aspects of the mid lung fields bilaterally. Findings suspicious for early viral pneumonia. Electronically Signed   By: Duanne Guess D.O.   On: 02/16/2020 14:29    Microbiology: Recent Results (from the past 240 hour(s))  Novel Coronavirus, NAA ([facility])     Status: Abnormal   Collection Time: 02/14/20  1:11 PM   Specimen: Nasopharyngeal(NP) swabs in vial transport medium   NASOPHARYNGE  TESTING  Result Value Ref Range Status   SARS-CoV-2, NAA Detected (A) Not Detected Final    Comment: This nucleic acid amplification test was developed and its performance characteristics determined by [employer]. Nucleic acid amplification tests include RT-PCR and TMA. This test has not been FDA cleared or approved. This test has been authorized by FDA under an Emergency Use Authorization (EUA). This test is only authorized for the duration of time the declaration that circumstances exist justifying the authorization of the emergency use of in vitro diagnostic tests for detection of SARS-CoV-2 virus and/or diagnosis of COVID-19 infection under section 564(b)(1) of the Act, 21 U.S.C. 175ZWC-5(E) (1), unless the authorization is terminated or revoked sooner. When diagnostic testing is negative, the possibility of a  false negative result should be considered in the context of a patient's recent exposures and the presence of clinical signs and symptoms consistent with COVID-19. An individual without symptoms of COVID-19 and who is not shedding SARS-CoV-2 virus wo uld expect to have a negative (not detected) result in this  assay.   Blood Culture (routine x 2)     Status: None (Preliminary result)   Collection Time: 02/16/20  3:32 PM   Specimen: Right Antecubital; Blood  Result Value Ref Range Status   Specimen Description RIGHT ANTECUBITAL  Final   Special Requests   Final    BOTTLES DRAWN AEROBIC AND ANAEROBIC Blood Culture adequate volume   Culture   Final    NO GROWTH 2 DAYS Performed at [facility], [address] [place], [place] [address]    Report Status PENDING  Incomplete  Blood Culture (routine x 2)     Status: None (Preliminary result)   Collection Time: 02/16/20  3:33 PM   Specimen: Right Antecubital; Blood  Result Value Ref Range Status   Specimen Description RIGHT ANTECUBITAL  Final   Special Requests   Final    BOTTLES DRAWN AEROBIC AND ANAEROBIC Blood Culture adequate volume   Culture   Final    NO GROWTH 2 DAYS Performed at [facility], [address], [place], [place] [address]    Report Status PENDING  Incomplete     Labs: Basic Metabolic Panel: Recent Labs  Lab 02/16/20 1532 02/17/20 0453 02/18/20 0506  NA 138 139 138  K 3.6 4.2 5.1  CL 101 102 104  CO2 26 25 23   GLUCOSE 128* 143* 131*  BUN 10 11 17   CREATININE 0.80 0.68 0.86  CALCIUM 8.6* 8.9 8.6*  MG  --  2.8* 2.8*  PHOS  --  3.4 4.2   Liver Function Tests: Recent Labs  Lab 02/16/20 1532 02/17/20 0453 02/18/20 0506  AST 26 25 25   ALT 24 24 20   ALKPHOS 54 57 49  BILITOT 0.6 0.4 0.9  PROT 7.3 7.7 6.8  ALBUMIN 3.6 3.7 3.5   CBC: Recent Labs  Lab 02/16/20 1532 02/17/20 0453 02/18/20 0506  WBC 3.8* 2.7* 6.8  NEUTROABS 2.3 2.1 4.9  HGB 13.4 14.2 13.3  HCT 43.0 46.2* 44.1  MCV 89.2 89.7 90.7  PLT 254 276 246    Signed:  Barton Dubois MD.  [facility] 02/18/2020, 1:14 PM

## 2020-02-18 NOTE — Progress Notes (Signed)
Pt D/C to home per MD orders. Instructions given to, reviewed, and understood by pt. Pt denies Covid sx. Pt denies pain. Tolerated lunch well. Pt is independent, getting dressed, and staff will escort amb to car that is parked in ER parking.

## 2020-02-18 NOTE — Progress Notes (Signed)
Patient scheduled for outpatient Remdesivir infusion at 11:30 AM on Saturday 2/20 and Sunday 2/21. Please advise them to report to [facility] at [address].  Drive to the security guard and tell them you are here for an infusion. They will direct you to the front entrance where we will come and get you.  For questions call [phone].  Thanks

## 2020-02-18 NOTE — Progress Notes (Signed)
Bedside report rec'd, assumed care. Pt presents AAOx4, denies c/o at this time. VSS. Will con't to monitor.

## 2020-02-19 ENCOUNTER — Ambulatory Visit (HOSPITAL_COMMUNITY)
Admission: RE | Admit: 2020-02-19 | Discharge: 2020-02-19 | Disposition: A | Discharge status: Home / Self Care | Payer: BC Managed Care – PPO | Source: Ambulatory Visit | Attending: Pulmonary Disease | Admitting: Pulmonary Disease

## 2020-02-19 VITALS — BP 115/86 | HR 65 | Temp 97.4°F | Resp 20

## 2020-02-19 DIAGNOSIS — U071 COVID-19: Secondary | ICD-10-CM | POA: Insufficient documentation

## 2020-02-19 DIAGNOSIS — J1282 Pneumonia due to coronavirus disease 2019: Secondary | ICD-10-CM | POA: Insufficient documentation

## 2020-02-19 MED ORDER — EPINEPHRINE 0.3 MG/0.3ML IJ SOAJ: 0.3000 mg | Freq: Once | INTRAMUSCULAR | Status: DC | PRN

## 2020-02-19 MED ORDER — SODIUM CHLORIDE 0.9 % IV SOLN
100.0000 mg | Freq: Once | INTRAVENOUS | Status: AC
  Administered 2020-02-19: 100 mg via INTRAVENOUS
  Filled 2020-02-19: qty 20

## 2020-02-19 MED ORDER — METHYLPREDNISOLONE SODIUM SUCC 125 MG IJ SOLR: 125.0000 mg | Freq: Once | INTRAMUSCULAR | Status: DC | PRN

## 2020-02-19 MED ORDER — ALBUTEROL SULFATE HFA 108 (90 BASE) MCG/ACT IN AERS: 2.0000 | INHALATION_SPRAY | Freq: Once | RESPIRATORY_TRACT | Status: DC | PRN

## 2020-02-19 MED ORDER — FAMOTIDINE IN NACL 20-0.9 MG/50ML-% IV SOLN: 20.0000 mg | Freq: Once | INTRAVENOUS | Status: DC | PRN

## 2020-02-19 MED ORDER — DIPHENHYDRAMINE HCL 50 MG/ML IJ SOLN: 50.0000 mg | Freq: Once | INTRAMUSCULAR | Status: DC | PRN

## 2020-02-19 MED ORDER — SODIUM CHLORIDE 0.9 % IV SOLN
INTRAVENOUS | Status: DC | PRN
  Administered 2020-02-19: 250 mL via INTRAVENOUS

## 2020-02-19 NOTE — Progress Notes (Signed)
  Diagnosis: COVID-19  Physician: Dr  Delford Field  Procedure: Covid Infusion Clinic Med: remdesivir infusion.  Complications: No immediate complications noted.  Discharge: Discharged home   Tonna Boehringer 02/19/2020

## 2020-02-19 NOTE — Discharge Instructions (Signed)
10 Things You Can Do to Manage Your COVID-19 Symptoms at Home If you have possible or confirmed COVID-19: 1. Stay home from work and school. And stay away from other public places. If you must go out, avoid using any kind of public transportation, ridesharing, or taxis. 2. Monitor your symptoms carefully. If your symptoms get worse, call your healthcare provider immediately. 3. Get rest and stay hydrated. 4. If you have a medical appointment, call the healthcare provider ahead of time and tell them that you have or may have COVID-19. 5. For medical emergencies, call 911 and notify the dispatch personnel that you have or may have COVID-19. 6. Cover your cough and sneezes with a tissue or use the inside of your elbow. 7. Wash your hands often with soap and water for at least 20 seconds or clean your hands with an alcohol-based hand sanitizer that contains at least 60% alcohol. 8. As much as possible, stay in a specific room and away from other people in your home. Also, you should use a separate bathroom, if available. If you need to be around other people in or outside of the home, wear a mask. 9. Avoid sharing personal items with other people in your household, like dishes, towels, and bedding. 10. Clean all surfaces that are touched often, like counters, tabletops, and doorknobs. Use household cleaning sprays or wipes according to the label instructions. cdc.gov/coronavirus 06/30/2019 This information is not intended to replace advice given to you by your health care provider. Make sure you discuss any questions you have with your health care provider. Document Revised: 12/02/2019 Document Reviewed: 12/02/2019 Elsevier Patient Education  2020 Elsevier Inc.  

## 2020-02-20 ENCOUNTER — Ambulatory Visit (HOSPITAL_COMMUNITY)
Admit: 2020-02-20 | Discharge: 2020-02-20 | Disposition: A | Discharge status: Home / Self Care | Payer: BC Managed Care – PPO | Attending: Pulmonary Disease | Admitting: Pulmonary Disease

## 2020-02-20 ENCOUNTER — Encounter (HOSPITAL_COMMUNITY): Payer: Self-pay

## 2020-02-20 ENCOUNTER — Encounter (INDEPENDENT_AMBULATORY_CARE_PROVIDER_SITE_OTHER): Payer: Self-pay

## 2020-02-20 DIAGNOSIS — U071 COVID-19: Secondary | ICD-10-CM | POA: Diagnosis not present

## 2020-02-20 DIAGNOSIS — J1282 Pneumonia due to coronavirus disease 2019: Secondary | ICD-10-CM

## 2020-02-20 MED ORDER — SODIUM CHLORIDE 0.9 % IV SOLN
100.0000 mg | Freq: Once | INTRAVENOUS | Status: AC
  Administered 2020-02-20: 11:00:00 100 mg via INTRAVENOUS

## 2020-02-20 MED ORDER — DIPHENHYDRAMINE HCL 50 MG/ML IJ SOLN: 50.0000 mg | Freq: Once | INTRAMUSCULAR | Status: DC | PRN

## 2020-02-20 MED ORDER — EPINEPHRINE 0.3 MG/0.3ML IJ SOAJ: 0.3000 mg | Freq: Once | INTRAMUSCULAR | Status: DC | PRN

## 2020-02-20 MED ORDER — FAMOTIDINE IN NACL 20-0.9 MG/50ML-% IV SOLN: 20.0000 mg | Freq: Once | INTRAVENOUS | Status: DC | PRN

## 2020-02-20 MED ORDER — SODIUM CHLORIDE 0.9 % IV SOLN: INTRAVENOUS | Status: DC | PRN

## 2020-02-20 MED ORDER — METHYLPREDNISOLONE SODIUM SUCC 125 MG IJ SOLR: 125.0000 mg | Freq: Once | INTRAMUSCULAR | Status: DC | PRN

## 2020-02-20 MED ORDER — ALBUTEROL SULFATE HFA 108 (90 BASE) MCG/ACT IN AERS: 2.0000 | INHALATION_SPRAY | Freq: Once | RESPIRATORY_TRACT | Status: DC | PRN

## 2020-02-20 NOTE — Discharge Instructions (Signed)
10 Things You Can Do to Manage Your COVID-19 Symptoms at Home If you have possible or confirmed COVID-19: 1. Stay home from work and school. And stay away from other public places. If you must go out, avoid using any kind of public transportation, ridesharing, or taxis. 2. Monitor your symptoms carefully. If your symptoms get worse, call your healthcare provider immediately. 3. Get rest and stay hydrated. 4. If you have a medical appointment, call the healthcare provider ahead of time and tell them that you have or may have COVID-19. 5. For medical emergencies, call 911 and notify the dispatch personnel that you have or may have COVID-19. 6. Cover your cough and sneezes with a tissue or use the inside of your elbow. 7. Wash your hands often with soap and water for at least 20 seconds or clean your hands with an alcohol-based hand sanitizer that contains at least 60% alcohol. 8. As much as possible, stay in a specific room and away from other people in your home. Also, you should use a separate bathroom, if available. If you need to be around other people in or outside of the home, wear a mask. 9. Avoid sharing personal items with other people in your household, like dishes, towels, and bedding. 10. Clean all surfaces that are touched often, like counters, tabletops, and doorknobs. Use household cleaning sprays or wipes according to the label instructions. cdc.gov/coronavirus 06/30/2019 This information is not intended to replace advice given to you by your health care provider. Make sure you discuss any questions you have with your health care provider. Document Revised: 12/02/2019 Document Reviewed: 12/02/2019 Elsevier Patient Education  2020 Elsevier Inc.  

## 2020-02-20 NOTE — Progress Notes (Signed)
  Diagnosis: COVID-19  Physician: Dr. Delford Field  Procedure: Covid Infusion Clinic Med: remdesivir infusion.  Complications: No immediate complications noted.  Discharge: Discharged home   Susan Robertson Susan Robertson 02/20/2020

## 2020-02-21 ENCOUNTER — Encounter (INDEPENDENT_AMBULATORY_CARE_PROVIDER_SITE_OTHER): Payer: Self-pay

## 2020-02-21 LAB — CULTURE, BLOOD (ROUTINE X 2)
Culture: NO GROWTH
Culture: NO GROWTH
Special Requests: ADEQUATE
Special Requests: ADEQUATE

## 2020-02-22 ENCOUNTER — Encounter (INDEPENDENT_AMBULATORY_CARE_PROVIDER_SITE_OTHER): Payer: Self-pay

## 2020-02-23 ENCOUNTER — Encounter (INDEPENDENT_AMBULATORY_CARE_PROVIDER_SITE_OTHER): Payer: Self-pay

## 2020-02-24 ENCOUNTER — Encounter (INDEPENDENT_AMBULATORY_CARE_PROVIDER_SITE_OTHER): Payer: Self-pay

## 2020-02-25 ENCOUNTER — Encounter (INDEPENDENT_AMBULATORY_CARE_PROVIDER_SITE_OTHER): Payer: Self-pay

## 2020-02-26 ENCOUNTER — Encounter (INDEPENDENT_AMBULATORY_CARE_PROVIDER_SITE_OTHER): Payer: Self-pay

## 2020-02-27 ENCOUNTER — Emergency Department (HOSPITAL_COMMUNITY): Payer: BC Managed Care – PPO

## 2020-02-27 ENCOUNTER — Other Ambulatory Visit: Payer: Self-pay

## 2020-02-27 ENCOUNTER — Encounter (HOSPITAL_COMMUNITY): Payer: Self-pay

## 2020-02-27 ENCOUNTER — Inpatient Hospital Stay (HOSPITAL_COMMUNITY)
Admission: EM | Admit: 2020-02-27 | Discharge: 2020-03-02 | DRG: 175 | Disposition: A | Discharge status: Home / Self Care | Payer: BC Managed Care – PPO | Attending: Internal Medicine | Admitting: Internal Medicine

## 2020-02-27 ENCOUNTER — Telehealth: Payer: Self-pay

## 2020-02-27 ENCOUNTER — Encounter (INDEPENDENT_AMBULATORY_CARE_PROVIDER_SITE_OTHER): Payer: Self-pay

## 2020-02-27 DIAGNOSIS — Z79899 Other long term (current) drug therapy: Secondary | ICD-10-CM

## 2020-02-27 DIAGNOSIS — G4720 Circadian rhythm sleep disorder, unspecified type: Secondary | ICD-10-CM | POA: Diagnosis present

## 2020-02-27 DIAGNOSIS — Z6841 Body Mass Index (BMI) 40.0 and over, adult: Secondary | ICD-10-CM | POA: Diagnosis not present

## 2020-02-27 DIAGNOSIS — G4730 Sleep apnea, unspecified: Secondary | ICD-10-CM | POA: Diagnosis present

## 2020-02-27 DIAGNOSIS — Z8616 Personal history of COVID-19: Secondary | ICD-10-CM | POA: Diagnosis not present

## 2020-02-27 DIAGNOSIS — I2699 Other pulmonary embolism without acute cor pulmonale: Secondary | ICD-10-CM | POA: Diagnosis present

## 2020-02-27 DIAGNOSIS — J9601 Acute respiratory failure with hypoxia: Secondary | ICD-10-CM | POA: Diagnosis not present

## 2020-02-27 DIAGNOSIS — G4733 Obstructive sleep apnea (adult) (pediatric): Secondary | ICD-10-CM | POA: Diagnosis present

## 2020-02-27 DIAGNOSIS — I2609 Other pulmonary embolism with acute cor pulmonale: Secondary | ICD-10-CM | POA: Diagnosis not present

## 2020-02-27 LAB — URINALYSIS, ROUTINE W REFLEX MICROSCOPIC
Bilirubin Urine: NEGATIVE
Glucose, UA: NEGATIVE mg/dL
Hgb urine dipstick: NEGATIVE
Ketones, ur: NEGATIVE mg/dL
Nitrite: NEGATIVE
Protein, ur: NEGATIVE mg/dL
Specific Gravity, Urine: >1.046 — ABNORMAL HIGH (ref 1.005–1.030)
pH: 6 (ref 5.0–8.0)

## 2020-02-27 LAB — CBC WITH DIFFERENTIAL/PLATELET
Abs Immature Granulocytes: 0.11 10*3/uL — ABNORMAL HIGH (ref 0.00–0.07)
Basophils Absolute: 0 10*3/uL (ref 0.0–0.1)
Basophils Relative: 0 %
Eosinophils Absolute: 0.1 10*3/uL (ref 0.0–0.5)
Eosinophils Relative: 0 %
HCT: 47.7 % — ABNORMAL HIGH (ref 36.0–46.0)
Hemoglobin: 15 g/dL (ref 12.0–15.0)
Immature Granulocytes: 1 %
Lymphocytes Relative: 17 %
Lymphs Abs: 2.3 10*3/uL (ref 0.7–4.0)
MCH: 27.8 pg (ref 26.0–34.0)
MCHC: 31.4 g/dL (ref 30.0–36.0)
MCV: 88.3 fL (ref 80.0–100.0)
Monocytes Absolute: 0.8 10*3/uL (ref 0.1–1.0)
Monocytes Relative: 5 %
Neutro Abs: 10.7 10*3/uL — ABNORMAL HIGH (ref 1.7–7.7)
Neutrophils Relative %: 77 %
Platelets: 270 10*3/uL (ref 150–400)
RBC: 5.4 MIL/uL — ABNORMAL HIGH (ref 3.87–5.11)
RDW: 14.3 % (ref 11.5–15.5)
WBC: 13.9 10*3/uL — ABNORMAL HIGH (ref 4.0–10.5)
nRBC: 0 % (ref 0.0–0.2)

## 2020-02-27 LAB — COMPREHENSIVE METABOLIC PANEL
ALT: 37 U/L (ref 0–44)
AST: 24 U/L (ref 15–41)
Albumin: 3.5 g/dL (ref 3.5–5.0)
Alkaline Phosphatase: 66 U/L (ref 38–126)
Anion gap: 12 (ref 5–15)
BUN: 20 mg/dL (ref 6–20)
CO2: 25 mmol/L (ref 22–32)
Calcium: 8.9 mg/dL (ref 8.9–10.3)
Chloride: 100 mmol/L (ref 98–111)
Creatinine, Ser: 0.88 mg/dL (ref 0.44–1.00)
GFR calc Af Amer: >60 mL/min (ref >60)
GFR calc non Af Amer: >60 mL/min (ref >60)
Glucose, Bld: 120 mg/dL — ABNORMAL HIGH (ref 70–99)
Potassium: 4.2 mmol/L (ref 3.5–5.1)
Sodium: 137 mmol/L (ref 135–145)
Total Bilirubin: 0.9 mg/dL (ref 0.3–1.2)
Total Protein: 7 g/dL (ref 6.5–8.1)

## 2020-02-27 LAB — PROTIME-INR
INR: 1 (ref 0.8–1.2)
Prothrombin Time: 13.1 seconds (ref 11.4–15.2)

## 2020-02-27 LAB — MRSA PCR SCREENING: MRSA by PCR: NEGATIVE

## 2020-02-27 LAB — GLUCOSE, CAPILLARY: Glucose-Capillary: 160 mg/dL — ABNORMAL HIGH (ref 70–99)

## 2020-02-27 LAB — APTT
aPTT: 25 seconds (ref 24–36)
aPTT: 43 seconds — ABNORMAL HIGH (ref 24–36)

## 2020-02-27 LAB — ABO/RH: ABO/RH(D): A POS

## 2020-02-27 MED ORDER — IOHEXOL 350 MG/ML SOLN
100.0000 mL | Freq: Once | INTRAVENOUS | Status: AC | PRN
  Administered 2020-02-27: 100 mL via INTRAVENOUS

## 2020-02-27 MED ORDER — HEPARIN (PORCINE) 25000 UT/250ML-% IV SOLN
1700.0000 [IU]/h | INTRAVENOUS | Status: DC
  Administered 2020-02-27 – 2020-02-28 (×2): 1400 [IU]/h via INTRAVENOUS
  Administered 2020-02-29 – 2020-03-01 (×2): 1700 [IU]/h via INTRAVENOUS
  Filled 2020-02-27 (×4): qty 250

## 2020-02-27 MED ORDER — SODIUM CHLORIDE 0.9 % IV SOLN
250.0000 mL | Freq: Once | INTRAVENOUS | Status: AC
  Administered 2020-02-27: 250 mL via INTRAVENOUS

## 2020-02-27 MED ORDER — ALTEPLASE 100 MG IV SOLR
100.0000 mg | Freq: Once | INTRAVENOUS | Status: AC
  Administered 2020-02-27: 100 mg via INTRAVENOUS
  Filled 2020-02-27: qty 100

## 2020-02-27 MED ORDER — PANTOPRAZOLE SODIUM 40 MG PO TBEC
40.0000 mg | DELAYED_RELEASE_TABLET | Freq: Every day | ORAL | Status: DC
  Administered 2020-02-28 – 2020-03-02 (×4): 40 mg via ORAL
  Filled 2020-02-27 (×4): qty 1

## 2020-02-27 MED ORDER — HEPARIN BOLUS VIA INFUSION: 4000.0000 [IU] | Freq: Once | INTRAVENOUS | Status: DC

## 2020-02-27 MED ORDER — CHLORHEXIDINE GLUCONATE CLOTH 2 % EX PADS
6.0000 | MEDICATED_PAD | Freq: Every day | CUTANEOUS | Status: DC
  Administered 2020-02-27 – 2020-02-28 (×2): 6 via TOPICAL

## 2020-02-27 NOTE — ED Notes (Signed)
[facility] arrived at this time and in room with patient. Pt's clothing bagged up in two separate patient belonging bags and all other belongings she has in her purse.

## 2020-02-27 NOTE — Progress Notes (Signed)
Case reviewed with ER PA. Given rapidity in onset of symptoms, dizziness and size of RV, I would recommend systemic thrombolytics (after risk/benefit discussion) and subsequent transfer to [facility] for further monitoring.  Myrla Halsted MD

## 2020-02-27 NOTE — Plan of Care (Signed)
  Problem: Clinical Measurements: Goal: Respiratory complications will improve Outcome: Progressing Note: RN able to decrease pt from 4L [place] to 2L [place]. Pt sating 100%   Problem: Nutrition: Goal: Adequate nutrition will be maintained Outcome: Progressing   Problem: Activity: Goal: Risk for activity intolerance will decrease Outcome: Not Progressing Note: Pt get short of breath with exertion.

## 2020-02-27 NOTE — Telephone Encounter (Signed)
Pt c/o worsening SOB last night and this am. Pt stated she wore her CPAP and still felt like she was not getting enough air. Pt stated she slept poor.  This am she is having SOB with activity and during call I noted that her SOB never went away. Pt covid + and recently dx with viral pna. Pt hospitalized form 02/16/20 to 02/18/20.  Pt denies, cough, fever, pain with deep breath, or tightness or pressure. Denies vision changes, headache, lightheadedness or numbness or tingling to face, arm or leg on either side. No visual disturbances. Pt's O2 saturation never got above 80% during call. Advised pt to go ahead and use her Albuterol MDI. Pt also c/o of left calf "deep muscle soreness." Pt rates pain a 5/10. Pt stated she wondered if her potassium was low. She stated that she received potassium supplementation while hospitalized. Pt stated pain is constant and feels the pain more when she is walking down steps. Initially pain was to both calves, now just the left calf. Pt stated that she is more weak today but is able to walk independently without holding onto anything for balance.  Advised pt to call 911, inform them of her sx and inform dispatch that she is positive for covid. Advised pt to be NPO. Pt verbalized understanding.

## 2020-02-27 NOTE — Progress Notes (Signed)
[facility] Physician-Brief Progress Note Patient Name: Susan Robertson DOB: 13-Jan-1966 MRN: [id number]   Date of Service  02/27/2020  HPI/Events of Note  Notified of being unable to get PTT to initiate heparin drip. Patient received TPA at 7:30 am for bilateral PE with right heart strain. Unable to reach bedside RN.  eICU Interventions  Informed bedside CCM as will need to get regular blood tests to ensure appropriate heparin dose in a post TPE patient     Intervention Category Major Interventions: Other:  Darl Pikes 02/27/2020, 9:16 PM

## 2020-02-27 NOTE — ED Notes (Signed)
Call from pharm He is apprised that pt has pending transfer to [facility] But actual transfer is dependent on truck availability of [facility] So unable to give specific time to transfer

## 2020-02-27 NOTE — H&P (Signed)
NAME:  Susan Robertson, MRN:  [id number], DOB:  1966-06-15, LOS: 0 ADMISSION DATE:  02/27/2020, CONSULTATION DATE:  02/27/2020  REFERRING MD:  ER Anni Penn, CHIEF COMPLAINT:  Submassive PE   Brief History   Submassive PE status post TPA systemic.  History of present illness    54 year old obese female with a history of sleep apnea initially presented to the hospital service?  At [facility] on 02/16/2020 with a 5-day history of shortness of breath and cough and hypoxemia with home testing and a positive home Covid test 2 days early on 02/14/2020.  At admission chest x-ray showed features of viral pneumonia.  She was given remdesivir for 3 days and and oral Decadron.  She was discharged 2 days later on 02/18/2020 with instructions to finish her remdesivir as an outpatient.  Her last remdesivir infusion was on 02/20/2020  She represented to [facility] emergency room 02/27/2020 with new onset shortness of breath since the night before and hypoxemia of 87% on room air.  In the ER her respiratory rate fluctuated anywhere between 24-31/min.  Her blood pressure consistently remained  104 systolic-129 systolic she improved requiring 4-6 L but was still labored and then required a nonrebreather per chart review.  She was afebrile.  Her heart rate was between 90-100/min.  CT angiogram showed RV-LV ratio of 2.1 and right pulmonary artery and lobar level pulmonary embolism.  There was RV strain.  She received alteplase 100 mg IV and was transferred to [facility] bed 3 and 14 for further monitoring.  Critical care medicine is admitting. Upon arrival to the ICU at [facility] she says she is beginning to feel better.  She is on IV heparin therapy currently.  No active bleeding and mentating fine.   Past Medical History     has a past medical history of Arthritis, Joint pain, and Sleep apnea.   reports that she has never smoked. She has never used smokeless tobacco.  Past Surgical History:   Procedure Laterality Date  . COLONOSCOPY WITH PROPOFOL N/A 11/05/2018   Procedure: COLONOSCOPY WITH PROPOFOL;  Surgeon: Corbin Ade, MD;  Location: [facility];  Service: Endoscopy;  Laterality: N/A;  1:30pm - pt knows to arrive at 8:00  . ingrown toenail Bilateral    Great toes  . KNEE ARTHROSCOPY Left 2014  . WISDOM TOOTH EXTRACTION      No Known Allergies   There is no immunization history on file for this patient.  Family History  Problem Relation Age of Onset  . Colon polyps Father 27  . Colon cancer Neg Hx      Current Facility-Administered Medications:  .  Chlorhexidine Gluconate Cloth 2 % PADS 6 each, 6 each, Topical, Daily, Lorin Glass, MD, 6 each at 02/27/20 1724   Significant Hospital Events   02/14/2020: Home Covid test positive 02/16/2020: Admitted for COVID-19 pneumonia and hypoxemia 02/18/2020: Discharged from hospital 02/20/2020: Completed remdesivir course outpatient xxxxx 02/27/2020: Admit and if in ER and subsequently to [facility] with submassive PE-status post TPA systemic  Consults:  *x   Procedures:  x  Significant Diagnostic Tests:  02/19/2020: CT angiogram chest showing submassive PE  Micro Data:  x  Antimicrobials:  x   Interim history/subjective:  02/27/2020: Seen at [facility], ICU  Objective   Blood pressure 133/75, pulse 90, temperature 98.3 F (36.8 C), temperature source Oral, resp. rate (!) 21, height 5\' 7"  (1.702 m), weight (!) 148 kg,  SpO2 100 %.        Intake/Output Summary (Last 24 hours) at 02/27/2020 1755 Last data filed at 02/27/2020 1738 Gross per 24 hour  Intake 70.99 ml  Output 1300 ml  Net -1229.01 ml   Filed Weights   02/27/20 0931 02/27/20 1652  Weight: (!) 152 kg (!) 148 kg    Examination: General: obese HENT: Mallampati class IV.  No neck nodes. Lungs: Distant breath sounds clear to auscultation bilaterally. Cardiovascular: Regular rate and rhythm.  Normal heart sounds Abdomen: Obese  nontender no organomegaly Extremities: No cyanosis no clubbing.  Chronic venous stasis edema present Neuro: Alert and oriented x3.  Speech normal moves all 4 extremities.  Good tone GU: Not examined  Resolved Hospital Problem list   X  Assessment & Plan:  Submassive pulmonary embolism status post TPA acute on  hypoxic respiratory failure -risk factor of COVID-19 2 weeks ago and morbid obesity and sedentary  Plan  -Neurochecks -Start IV heparin and continue this for the next 2-5 days and then switch to oral anticoagulation -Keep pulse ox greater than 88% -Monitor for bleeding -Avoid arterial sticks -Bedrest through 02/28/2020  Outpatient sleep apnea  Plan  - cpap QHS in hospital     Best practice:  Diet: Heart healthy low-carb diet Pain/Anxiety/Delirium protocol (if indicated): Not applicable VAP protocol (if indicated): Not applicable but head of bed greater than 30 degrees DVT prophylaxis: IV heparin for PE GI prophylaxis: PPI Glucose control: SSI Mobility: Bedrest Code Status: Full Family Communication: Directly to the patient at bedside Disposition: ICU  If stable can probably go to the floor 02/28/2020       ATTESTATION & SIGNATURE   The patient Susan Robertson is critically ill with multiple organ systems failure and requires high complexity decision making for assessment and support, frequent evaluation and titration of therapies, application of advanced monitoring technologies and extensive interpretation of multiple databases.   Critical Care Time devoted to patient care services described in this note is  40  Minutes. This time reflects time of care of this signee Dr Kalman Shan. This critical care time does not reflect procedure time, or teaching time or supervisory time of PA/NP/Med student/Med Resident etc but could involve care discussion time     Dr. Kalman Shan, M.D., [facility].C.P Pulmonary and Critical Care Medicine Staff Physician [facility] [place] [facility] Pager: [phone], If no answer or between  15:00h - 7:00h: call [phone]  02/27/2020 5:55 PM    LABS    PULMONARY No results for input(s): PHART, PCO2ART, PO2ART, HCO3, TCO2, O2SAT in the last 168 hours.  Invalid input(s): PCO2, PO2  CBC Recent Labs  Lab 02/27/20 1026  HGB 15.0  HCT 47.7*  WBC 13.9*  PLT 270    COAGULATION Recent Labs  Lab 02/27/20 1025  INR 1.0    CARDIAC  No results for input(s): TROPONINI in the last 168 hours. No results for input(s): PROBNP in the last 168 hours.   CHEMISTRY Recent Labs  Lab 02/27/20 1026  NA 137  K 4.2  CL 100  CO2 25  GLUCOSE 120*  BUN 20  CREATININE 0.88  CALCIUM 8.9   Estimated Creatinine Clearance: 112.3 mL/min (by C-G formula based on SCr of 0.88 mg/dL).   LIVER Recent Labs  Lab 02/27/20 1025 02/27/20 1026  AST  --  24  ALT  --  37  ALKPHOS  --  66  BILITOT  --  0.9  PROT  --  7.0  ALBUMIN  --  3.5  INR 1.0  --      INFECTIOUS No results for input(s): LATICACIDVEN, PROCALCITON in the last 168 hours.   ENDOCRINE CBG (last 3)  No results for input(s): GLUCAP in the last 72 hours.       IMAGING x48h  - image(s) personally visualized  -   highlighted in bold CT Angio Chest PE W and/or Wo Contrast  Result Date: 02/27/2020 CLINICAL DATA:  Shortness of breath, positive COVID test 2 weeks ago EXAM: CT ANGIOGRAPHY CHEST WITH CONTRAST TECHNIQUE: Multidetector CT imaging of the chest was performed using the standard protocol during bolus administration of intravenous contrast. Multiplanar CT image reconstructions and MIPs were obtained to evaluate the vascular anatomy. CONTRAST:  147mL OMNIPAQUE IOHEXOL 350 MG/ML SOLN COMPARISON:  Chest x-ray 02/26/2020 FINDINGS: Cardiovascular: Signs of bilateral lobar level emboli and embolus in the right main pulmonary artery. Emboli extend into upper and lower lobe branches on the right and left. Signs of right  heart strain with RV to LV ratio of approximately 2.1. Thoracic aorta is normal. No signs of pericardial effusion. Mediastinum/Nodes: No signs of axillary adenopathy. Thoracic inlet structures are normal. No signs of mediastinal or hilar adenopathy. Esophagus is normal. Lungs/Pleura: Basilar atelectasis. Airways are patent. Tiny nodule at the left lung base measuring 3 mm (image 92, series 7) no signs of pleural effusion. Tiny pulmonary nodule in the right upper lobe measuring approximately 3 mm (image 53, series 7) Upper Abdomen: Incidental imaging of upper abdominal contents shows lobulated hepatic contour with low-density lesion in the right hepatic lobe measuring measuring water density at 2.4 x 1.8 cm. Lobular hepatic contours with suggestion of background hepatic steatosis. Musculoskeletal: Spinal degenerative changes without acute or destructive bone process. Review of the MIP images confirms the above findings. IMPRESSION: 1. Signs of bilateral lobar level pulmonary emboli and embolus in the right main pulmonary artery. Signs of right heart strain with RV to LV ratio of approximately 2.1. Large volume of pulmonary emboli in the lungs bilaterally, with dilatation of the right atrium and right ventricle (RV to LV ratioof 2.1) indicative of elevated right-sided heart pressures and right heart strain. These findings have been shown to be associated with a increased morbidity and mortality in the setting pulmonary embolism. Critical Value/emergent results were called by telephone at the time of interpretation on 02/27/2020 at 1:22 pm to provider [facility] , who verbally acknowledged these results. 2. Low-density lesion in the dome of the liver is likely a cyst, not well evaluated on the current study. Ultrasound follow-up is suggested in this patient with hepatic steatosis and lobular hepatic contours. 3. Tiny pulmonary nodules in the right upper lobe and left lung base. No follow-up needed if patient is  low-risk (and has no known or suspected primary neoplasm). Non-contrast chest CT can be considered in 12 months if patient is high-risk. This recommendation follows the consensus statement: Guidelines for Management of Incidental Pulmonary Nodules Detected on CT Images:From the Fleischner Society 2017; published online before print ( ID Nonie Hoyer. ID ). Electronically Signed   By: Zetta Bills M.D.   On: 02/27/2020 13:28

## 2020-02-27 NOTE — Progress Notes (Addendum)
ANTICOAGULATION CONSULT NOTE - Initial Consult  Pharmacy Consult for heparin gtt  Indication: pulmonary embolus  No Known Allergies  Patient Measurements: Height: 5\' 7"  (170.2 cm) Weight: (!) 335 lb 1.6 oz (152 kg) IBW/kg (Calculated) : 61.6 Heparin Dosing Weight: HEPARIN DW (KG): 99.5   Vital Signs: Temp: 98.2 F (36.8 C) (02/28 0934) Temp Source: Oral (02/28 0934) BP: 104/77 (02/28 1230) Pulse Rate: 94 (02/28 1230)  Labs: Recent Labs    02/27/20 1026  HGB 15.0  HCT 47.7*  PLT 270  CREATININE 0.88    Estimated Creatinine Clearance: 114.1 mL/min (by C-G formula based on SCr of 0.88 mg/dL).   Medical History: Past Medical History:  Diagnosis Date  . Arthritis   . Joint pain   . Sleep apnea    in process of getting CPAP.    Medications:  (Not in a hospital admission)  Scheduled:  . alteplase  100 mg Intravenous Once   Infusions:  . sodium chloride     PRN:  Anti-infectives (From admission, onward)   None      Assessment: Susan Robertson a 54 y.o. female requires anticoagulation with a heparin iv infusion for the indication of  pulmonary embolus. Heparin gtt will be started following pharmacy protocol per pharmacy consult. Patient is not on previous oral anticoagulant that will require aPTT/HL correlation before transitioning to only HL monitoring.   Patient is currently receiving alteplase 100mg  IV for PE.   Goal of Therapy:  Heparin level 0.3-0.7 units/ml Monitor platelets by anticoagulation protocol: Yes   Plan: Once aPTT < 80 seconds   Start heparin infusion at 1600 units/hr Check anti-Xa level in 6 hours and daily while on heparin Continue to monitor H&H and platelets  Heparin level to be drawn in 6 hours.  Latravious Levitt 02/27/2020,1:50 PM

## 2020-02-27 NOTE — Progress Notes (Signed)
ANTICOAGULATION CONSULT NOTE - Initial Consult  Pharmacy Consult for heparin Indication: pulmonary embolus  No Known Allergies  Patient Measurements: Height: 5\' 7"  (170.2 cm) Weight: (!) 326 lb 4.5 oz (148 kg) IBW/kg (Calculated) : 61.6 Heparin Dosing Weight: 98.3 kg   Vital Signs: Temp: 98.3 F (36.8 C) (02/28 1652) Temp Source: Oral (02/28 1652) BP: 133/75 (02/28 1815) Pulse Rate: 88 (02/28 1815)  Labs: Recent Labs    02/27/20 1025 02/27/20 1026  HGB  --  15.0  HCT  --  47.7*  PLT  --  270  APTT 25  --   LABPROT 13.1  --   INR 1.0  --   CREATININE  --  0.88    Estimated Creatinine Clearance: 112.3 mL/min (by C-G formula based on SCr of 0.88 mg/dL).   Medical History: Past Medical History:  Diagnosis Date  . Arthritis   . Joint pain   . Sleep apnea    in process of getting CPAP.    Medications:  Scheduled:  . Chlorhexidine Gluconate Cloth  6 each Topical Daily  . pantoprazole  40 mg Oral Daily    Assessment: 53 yof with recent COVID infection presenting with new onset SOB and hypoxemia - found to have PE with RHS. Received alteplase on 2/28 at 1417.   Hgb 15, plt 270. No s/sx of bleeding. Had difficulties obtaining lab draws after multiple attempts- once aPTT drawn it resulted at 43 - okay to start heparin.    Goal of Therapy:  Heparin level 0.3-0.5 units/ml for first 24 hours then 0.3-0.7 Monitor platelets by anticoagulation protocol: Yes   Plan:  Start heparin infusion at 1400 units/hr Check anti-Xa level in 6 hours and daily while on heparin Continue to monitor H&H and platelets  3/28, PharmD, BCCCP Clinical Pharmacist  Phone: [phone]  Please check AMION for all [facility] Pharmacy phone numbers After 10:00 PM, call Main Pharmacy [phone] 02/27/2020,6:38 PM

## 2020-02-27 NOTE — ED Notes (Signed)
Report given to Lisa,RN at [place] 

## 2020-02-27 NOTE — ED Provider Notes (Signed)
[facility] EMERGENCY DEPARTMENT Provider Note   CSN: [id number] Arrival date & time: 02/27/20  0623     History Chief Complaint  Patient presents with  . Shortness of Breath    Susan Robertson is a 54 y.o. female.  Pt was hospitalized with covid on 2/17.  Pt followed up at infusion center.  Pt was improved until today.  Pt reports soreness in her left leg and shortness of breath.    The history is provided by the patient. No language interpreter was used.  Shortness of Breath Severity:  Moderate Onset quality:  Gradual Duration:  1 day Timing:  Constant Progression:  Worsening Chronicity:  New Relieved by:  Nothing Worsened by:  Nothing Ineffective treatments:  None tried Associated symptoms: wheezing   Risk factors: no hx of PE/DVT        Past Medical History:  Diagnosis Date  . Arthritis   . Joint pain   . Sleep apnea    in process of getting CPAP.    Patient Active Problem List   Diagnosis Date Noted  . Obesity, Class III, BMI 40-49.9 (morbid obesity) (HCC)   . Pneumonia due to COVID-19 virus 02/16/2020  . OSA (obstructive sleep apnea) 12/07/2018  . 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 Encounter for screening colonoscopy 10/14/2018    Past Surgical History:  Procedure Laterality Date  . COLONOSCOPY WITH PROPOFOL N/A 11/05/2018   Procedure: COLONOSCOPY WITH PROPOFOL;  Surgeon: Corbin Ade, MD;  Location[facility];  Service: Endoscopy;  Laterality: N/A;  1:30pm - pt knows to arrive at 8:00  . ingrown toenail Bilateral    Great toes  . KNEE ARTHROSCOPY Left 2014  . WISDOM TOOTH EXTRACTION       OB History   No obstetric history on file.     Family History  Problem Relation Age of Onset  . Colon polyps Father 7  . Colon cancer Neg Hx     Social History   Tobacco Use  . Smoking status: Never Smoker  . Smokeless tobacco: Never Used  Substance Use Topics  . Alcohol use: Yes    Comment: occas  . Drug use: Never    Home  Medications Prior to Admission medications   Medication Sig Start Date End Date Taking? Authorizing Provider  acetaminophen (TYLENOL) 500 MG tablet Take 500 mg by mouth every 6 (six) hours as needed.   Yes [provider]  albuterol (VENTOLIN HFA) 108 (90 Base) MCG/ACT inhaler Inhale 2 puffs into the lungs every 6 (six) hours as needed for wheezing or shortness of breath.   Yes [provider]  ascorbic acid (VITAMIN C) 500 MG tablet Take 1 tablet (500 mg total) by mouth daily. 02/19/20  Yes Vassie Loll, MD  omeprazole (PRILOSEC) 20 MG capsule Take 1 capsule (20 mg total) by mouth daily. 02/18/20 02/17/21 Yes Vassie Loll, MD  zinc sulfate 220 (50 Zn) MG capsule Take 1 capsule (220 mg total) by mouth daily. 02/19/20  Yes Vassie Loll, MD  dexamethasone (DECADRON) 6 MG tablet Take 6 mg by mouth daily.    [provider]  pseudoephedrine (SUDAFED) 120 MG 12 hr tablet Take 120 mg by mouth every 12 (twelve) hours as needed for congestion.    [provider]    Allergies    Patient has no known allergies.  Review of Systems   Review of Systems  Respiratory: Positive for shortness of breath and wheezing.   All other systems reviewed  and are negative.   Physical Exam Updated Vital Signs BP 104/77   Pulse 94   Temp 98.2 F (36.8 C) (Oral)   Resp (!) 22   Ht 5\' 7"  (1.702 m)   Wt (!) 152 kg   SpO2 95%   BMI 52.48 kg/m   Physical Exam Vitals and nursing note reviewed.  Constitutional:      Appearance: She is well-developed.  HENT:     Head: Normocephalic.  Cardiovascular:     Rate and Rhythm: Normal rate and regular rhythm.  Pulmonary:     Effort: Pulmonary effort is normal.     Breath sounds: Normal breath sounds.  Chest:     Chest wall: No mass or deformity.  Abdominal:     General: There is no distension.  Musculoskeletal:        General: Normal range of motion.     Cervical back: Normal range of motion.  Skin:    General: Skin is  warm.  Neurological:     General: No focal deficit present.     Mental Status: She is alert and oriented to person, place, and time.  Psychiatric:        Mood and Affect: Mood normal.     ED Results / Procedures / Treatments   Labs (all labs ordered are listed, but only abnormal results are displayed) Labs Reviewed  CBC WITH DIFFERENTIAL/PLATELET - Abnormal; Notable for the following components:      Result Value   WBC 13.9 (*)    RBC 5.40 (*)    HCT 47.7 (*)    Neutro Abs 10.7 (*)    Abs Immature Granulocytes 0.11 (*)    All other components within normal limits  COMPREHENSIVE METABOLIC PANEL - Abnormal; Notable for the following components:   Glucose, Bld 120 (*)    All other components within normal limits    EKG None  Radiology CT Angio Chest PE W and/or Wo Contrast  Result Date: 02/27/2020 CLINICAL DATA:  Shortness of breath, positive COVID test 2 weeks ago EXAM: CT ANGIOGRAPHY CHEST WITH CONTRAST TECHNIQUE: Multidetector CT imaging of the chest was performed using the standard protocol during bolus administration of intravenous contrast. Multiplanar CT image reconstructions and MIPs were obtained to evaluate the vascular anatomy. CONTRAST:  02/29/2020 OMNIPAQUE IOHEXOL 350 MG/ML SOLN COMPARISON:  Chest x-ray 02/26/2020 FINDINGS: Cardiovascular: Signs of bilateral lobar level emboli and embolus in the right main pulmonary artery. Emboli extend into upper and lower lobe branches on the right and left. Signs of right heart strain with RV to LV ratio of approximately 2.1. Thoracic aorta is normal. No signs of pericardial effusion. Mediastinum/Nodes: No signs of axillary adenopathy. Thoracic inlet structures are normal. No signs of mediastinal or hilar adenopathy. Esophagus is normal. Lungs/Pleura: Basilar atelectasis. Airways are patent. Tiny nodule at the left lung base measuring 3 mm (image 92, series 7) no signs of pleural effusion. Tiny pulmonary nodule in the right upper lobe  measuring approximately 3 mm (image 53, series 7) Upper Abdomen: Incidental imaging of upper abdominal contents shows lobulated hepatic contour with low-density lesion in the right hepatic lobe measuring measuring water density at 2.4 x 1.8 cm. Lobular hepatic contours with suggestion of background hepatic steatosis. Musculoskeletal: Spinal degenerative changes without acute or destructive bone process. Review of the MIP images confirms the above findings. IMPRESSION: 1. Signs of bilateral lobar level pulmonary emboli and embolus in the right main pulmonary artery. Signs of right heart strain with RV to  LV ratio of approximately 2.1. Large volume of pulmonary emboli in the lungs bilaterally, with dilatation of the right atrium and right ventricle (RV to LV ratioof 2.1) indicative of elevated right-sided heart pressures and right heart strain. These findings have been shown to be associated with a increased morbidity and mortality in the setting pulmonary embolism. Critical Value/emergent results were called by telephone at the time of interpretation on 02/27/2020 at 1:22 pm to provider [facility] , who verbally acknowledged these results. 2. Low-density lesion in the dome of the liver is likely a cyst, not well evaluated on the current study. Ultrasound follow-up is suggested in this patient with hepatic steatosis and lobular hepatic contours. 3. Tiny pulmonary nodules in the right upper lobe and left lung base. No follow-up needed if patient is low-risk (and has no known or suspected primary neoplasm). Non-contrast chest CT can be considered in 12 months if patient is high-risk. This recommendation follows the consensus statement: Guidelines for Management of Incidental Pulmonary Nodules Detected on CT Images:From the Fleischner Society 2017; published online before print ( ID Nonie Hoyer. ID ). Electronically Signed   By: Zetta Bills M.D.   On: 02/27/2020 13:28    Procedures .Critical Care Performed by:  Fransico Meadow, PA-C Authorized by: Fransico Meadow, PA-C   Critical care provider statement:    Critical care time (minutes):  90   Critical care start time:  02/27/2020 10:00 AM   Critical care end time:  02/27/2020 3:10 PM   Critical care time was exclusive of:  Separately billable procedures and treating other patients   Critical care was necessary to treat or prevent imminent or life-threatening deterioration of the following conditions:  Cardiac failure, circulatory failure, CNS failure or compromise and shock   Critical care was time spent personally by me on the following activities:  Blood draw for specimens, development of treatment plan with patient or surrogate, discussions with consultants, evaluation of patient's response to treatment, examination of patient, interpretation of cardiac output measurements, obtaining history from patient or surrogate, re-evaluation of patient's condition, pulse oximetry, ordering and review of radiographic studies, ordering and review of laboratory studies, ordering and performing treatments and interventions and review of old charts   I assumed direction of critical care for this patient from another provider in my specialty: no   Comments:     Pt counseled on risk of TPA. No contraindications known.  Will continue close  monitoring    (including critical care time)  Medications Ordered in ED Medications  heparin bolus via infusion 4,000 Units (has no administration in time range)  iohexol (OMNIPAQUE) 350 MG/ML injection 100 mL (100 mLs Intravenous Contrast Given 02/27/20 1236)    ED Course  I have reviewed the triage vital signs and the nursing notes.  Pertinent labs & imaging results that were available during my care of the patient were reviewed by me and considered in my medical decision making (see chart for details).    MDM Rules/Calculators/A&P                     . MDM:  Pt used her albuterol.  Pt reports she feels better on oxygen  now.  02 on 2 liters. 95%Radiologist called and reports bilat submassive PE. With heart strain, greater than 1.9 I spoke with Dr. Tamala Julian Critical care MD.  He advised tpa and send to [facility] ICU.  Final Clinical Impression(s) / ED Diagnoses Final diagnoses:  Acute pulmonary embolism, unspecified  pulmonary embolism type, unspecified whether acute cor pulmonale present [facility])    Rx / DC Orders ED Discharge Orders    None       Osie Cheeks 02/27/20 1549    Bethann Berkshire, MD 02/28/20 [phone]

## 2020-02-27 NOTE — Progress Notes (Signed)
Pharmacy ordered at PTT to determine baseline prior to heparin gtt was started. RN attempted to draw labs off peripheral IVs but was unsuccessful. Dr. Marchelle Gearing made aware of RNs attempts. Per MD, patient may have a venous stick (1-2 tries) for blood draw. Pt is to have no arterial sticks AT ALL. He also stated that pressure must be held for five minutes post blood draw. This RN passed information along to oncoming night shift RN.

## 2020-02-27 NOTE — ED Triage Notes (Signed)
EMS reports pt tested positive for covid 2 weeks ago.  Reports diagnosed with pneumonia.  C/O sob worse since last night.  Pt attempted to drive self to hospital but unable.  EMS arrived, pt resp distress.  O2sat 87% on room air.  Pt took 2 puffs albuterol pta and helped "some."   EMS put pt on 4-6 liters and sat increased to 92-93% but still labored so they put pt on NRB.  Reports etco2 24-25.    Pt says was admitted for a few days and has been going to the infusion center for covid.

## 2020-02-27 NOTE — ED Notes (Addendum)
Spoke with Ruby at [facility] to set up transport.

## 2020-02-27 NOTE — ED Notes (Signed)
Report given to [facility] with [facility] at this time. Truck in route to transfer pt to [facility].

## 2020-02-28 DIAGNOSIS — G4733 Obstructive sleep apnea (adult) (pediatric): Secondary | ICD-10-CM

## 2020-02-28 LAB — CBC WITH DIFFERENTIAL/PLATELET
Abs Immature Granulocytes: 0.06 10*3/uL (ref 0.00–0.07)
Basophils Absolute: 0 10*3/uL (ref 0.0–0.1)
Basophils Relative: 0 %
Eosinophils Absolute: 0.1 10*3/uL (ref 0.0–0.5)
Eosinophils Relative: 1 %
HCT: 42.4 % (ref 36.0–46.0)
Hemoglobin: 13.5 g/dL (ref 12.0–15.0)
Immature Granulocytes: 1 %
Lymphocytes Relative: 27 %
Lymphs Abs: 2.5 10*3/uL (ref 0.7–4.0)
MCH: 27.7 pg (ref 26.0–34.0)
MCHC: 31.8 g/dL (ref 30.0–36.0)
MCV: 87.1 fL (ref 80.0–100.0)
Monocytes Absolute: 0.7 10*3/uL (ref 0.1–1.0)
Monocytes Relative: 7 %
Neutro Abs: 6 10*3/uL (ref 1.7–7.7)
Neutrophils Relative %: 64 %
Platelets: 220 10*3/uL (ref 150–400)
RBC: 4.87 MIL/uL (ref 3.87–5.11)
RDW: 14.4 % (ref 11.5–15.5)
WBC: 9.3 10*3/uL (ref 4.0–10.5)
nRBC: 0 % (ref 0.0–0.2)

## 2020-02-28 LAB — COMPREHENSIVE METABOLIC PANEL
ALT: 28 U/L (ref 0–44)
AST: 21 U/L (ref 15–41)
Albumin: 2.9 g/dL — ABNORMAL LOW (ref 3.5–5.0)
Alkaline Phosphatase: 56 U/L (ref 38–126)
Anion gap: 12 (ref 5–15)
BUN: 16 mg/dL (ref 6–20)
CO2: 24 mmol/L (ref 22–32)
Calcium: 8.6 mg/dL — ABNORMAL LOW (ref 8.9–10.3)
Chloride: 103 mmol/L (ref 98–111)
Creatinine, Ser: 0.95 mg/dL (ref 0.44–1.00)
GFR calc Af Amer: >60 mL/min (ref >60)
GFR calc non Af Amer: >60 mL/min (ref >60)
Glucose, Bld: 143 mg/dL — ABNORMAL HIGH (ref 70–99)
Potassium: 3.8 mmol/L (ref 3.5–5.1)
Sodium: 139 mmol/L (ref 135–145)
Total Bilirubin: 0.9 mg/dL (ref 0.3–1.2)
Total Protein: 5.7 g/dL — ABNORMAL LOW (ref 6.5–8.1)

## 2020-02-28 LAB — MAGNESIUM: Magnesium: 2.3 mg/dL (ref 1.7–2.4)

## 2020-02-28 LAB — PHOSPHORUS: Phosphorus: 5.1 mg/dL — ABNORMAL HIGH (ref 2.5–4.6)

## 2020-02-28 LAB — URINE CULTURE
Culture: NO GROWTH
Special Requests: NORMAL

## 2020-02-28 LAB — HEPARIN LEVEL (UNFRACTIONATED)
Heparin Unfractionated: 0.38 IU/mL (ref 0.30–0.70)
Heparin Unfractionated: 0.35 IU/mL (ref 0.30–0.70)

## 2020-02-28 LAB — LACTIC ACID, PLASMA: Lactic Acid, Venous: 1.6 mmol/L (ref 0.5–1.9)

## 2020-02-28 LAB — GLUCOSE, CAPILLARY
Glucose-Capillary: 139 mg/dL — ABNORMAL HIGH (ref 70–99)
Glucose-Capillary: 121 mg/dL — ABNORMAL HIGH (ref 70–99)

## 2020-02-28 LAB — TROPONIN I (HIGH SENSITIVITY): Troponin I (High Sensitivity): 54 ng/L — ABNORMAL HIGH (ref <18)

## 2020-02-28 LAB — PROTIME-INR
INR: 1.4 — ABNORMAL HIGH (ref 0.8–1.2)
Prothrombin Time: 16.7 seconds — ABNORMAL HIGH (ref 11.4–15.2)

## 2020-02-28 MED ORDER — ACETAMINOPHEN 325 MG PO TABS
650.0000 mg | ORAL_TABLET | ORAL | Status: DC | PRN
  Administered 2020-02-28 – 2020-02-29 (×3): 650 mg via ORAL
  Filled 2020-02-28 (×3): qty 2

## 2020-02-28 NOTE — Progress Notes (Signed)
RT set up CPAP and placed on patient with 2L O2 bled into circuit. Patient is on home CPAP settings and tolerating well at this time. RT will monitor as needed.

## 2020-02-28 NOTE — Progress Notes (Signed)
NAME:  Susan Robertson, MRN:  [id number], DOB:  1966-02-25, LOS: 1 ADMISSION DATE:  02/27/2020, CONSULTATION DATE:  02/27/2020  REFERRING MD:  ER Anni Penn, CHIEF COMPLAINT:  Submassive PE   Brief History   Submassive PE status post TPA systemic.  History of present illness    54 year old obese female with a history of sleep apnea initially presented to the hospital service?  At [facility] on 02/16/2020 with a 5-day history of shortness of breath and cough and hypoxemia with home testing and a positive home Covid test 2 days early on 02/14/2020.  At admission chest x-ray showed features of viral pneumonia.  She was given remdesivir for 3 days and and oral Decadron.  She was discharged 2 days later on 02/18/2020 with instructions to finish her remdesivir as an outpatient.  Her last remdesivir infusion was on 02/20/2020  She represented to [facility] emergency room 02/27/2020 with new onset shortness of breath since the night before and hypoxemia of 87% on room air.  In the ER her respiratory rate fluctuated anywhere between 24-31/min.  Her blood pressure consistently remained  104 systolic-129 systolic she improved requiring 4-6 L but was still labored and then required a nonrebreather per chart review.  She was afebrile.  Her heart rate was between 90-100/min.  CT angiogram showed RV-LV ratio of 2.1 and right pulmonary artery and lobar level pulmonary embolism.  There was RV strain.  She received alteplase 100 mg IV and was transferred to [facility] bed 3 and 14 for further monitoring.  Critical care medicine is admitting. Upon arrival to the ICU at [facility] she says she is beginning to feel better.  She is on IV heparin therapy currently.  No active bleeding and mentating fine.   Past Medical History     has a past medical history of Arthritis, Joint pain, and Sleep apnea.   reports that she has never smoked. She has never used smokeless tobacco.  Past Surgical History:   Procedure Laterality Date  . COLONOSCOPY WITH PROPOFOL N/A 11/05/2018   Procedure: COLONOSCOPY WITH PROPOFOL;  Surgeon: Corbin Ade, MD;  Location: [facility] ENDO SUITE;  Service: Endoscopy;  Laterality: N/A;  1:30pm - pt knows to arrive at 8:00  . ingrown toenail Bilateral    Great toes  . KNEE ARTHROSCOPY Left 2014  . WISDOM TOOTH EXTRACTION      No Known Allergies   There is no immunization history on file for this patient.  Family History  Problem Relation Age of Onset  . Colon polyps Father 70  . Colon cancer Neg Hx      Current Facility-Administered Medications:  .  acetaminophen (TYLENOL) tablet 650 mg, 650 mg, Oral, Q4H PRN, Darl Pikes, MD, 650 mg at 02/28/20 0146 .  Chlorhexidine Gluconate Cloth 2 % PADS 6 each, 6 each, Topical, Daily, Lorin Glass, MD, 6 each at 02/27/20 1724 .  heparin ADULT infusion 100 units/mL (25000 units/24mL sodium chloride 0.45%), 1,400 Units/hr, Intravenous, Continuous, Ramaswamy, Murali, MD, Last Rate: 14 mL/hr at 02/28/20 0800, 1,400 Units/hr at 02/28/20 0800 .  pantoprazole (PROTONIX) EC tablet 40 mg, 40 mg, Oral, Daily, Kalman Shan, MD   Significant Hospital Events   02/14/2020: Home Covid test positive 02/16/2020: Admitted for COVID-19 pneumonia and hypoxemia 02/18/2020: Discharged from hospital 02/20/2020: Completed remdesivir course outpatient xxxxx 02/27/2020: Admit and if in ER and subsequently to [facility] with submassive PE-status post TPA systemic  Consults:  None  Procedures:  None   Significant Diagnostic Tests:  02/19/2020: CT angiogram chest showing submassive PE  Micro Data:  None Antimicrobials:  None  Interim history/subjective:  3/1 Feeling better Less short of breath Has not moved around much  Objective   Blood pressure (!) 112/96, pulse 82, temperature 97.8 F (36.6 C), temperature source Oral, resp. rate 20, height 5\' 7"  (1.702 m), weight (!) 148.7 kg, SpO2 97 %.         Intake/Output Summary (Last 24 hours) at 02/28/2020 0857 Last data filed at 02/28/2020 0800 Gross per 24 hour  Intake 781.59 ml  Output 2525 ml  Net -1743.41 ml   Filed Weights   02/27/20 0931 02/27/20 1652 02/28/20 0500  Weight: (!) 152 kg (!) 148 kg (!) 148.7 kg    Examination: General: obese HENT: Mallampati 4 Lungs: Clear breath sounds bilaterally  cardiovascular: S1-S2 appreciated Abdomen: Obese nontender no organomegaly Extremities: No cyanosis no clubbing.  Chronic venous stasis edema present Neuro: Alert and oriented x3.  Speech normal moves all 4 extremities.  Good tone GU: Not examined  Resolved Hospital Problem list     Assessment & Plan:  Submassive pulmonary embolism status post TPA acute Hypoxic respiratory failure -risk factor of COVID-19 2 weeks ago and morbid obesity and sedentary  Plan  -Neurochecks -Continue IV heparin and continue this for the next 2-5 days and then switch to oral anticoagulation -Keep pulse ox greater than 88% -Monitor for bleeding -Avoid arterial sticks  May start getting out of bed into chair  sleep apnea - cpap QHS in hospital  Circadian rhythm sleep disorder  Pneumonia due to COVID-19 virus  Obesity  Stable  Best practice:  Diet: Heart healthy low-carb diet Pain/Anxiety/Delirium protocol (if indicated): Not applicable VAP protocol (if indicated): Not applicable but head of bed greater than 30 degrees DVT prophylaxis: IV heparin for PE GI prophylaxis: PPI Glucose control: SSI Mobility: Bedrest Code Status: Full Family Communication: Directly to the patient at bedside Disposition: ICU  Will transition to medical floor today  ATTESTATION & SIGNATURE   The patient is critically ill with multiple organ systems failure and requires high complexity decision making for assessment and support, frequent evaluation and titration of therapies, application of advanced monitoring technologies and extensive interpretation of  multiple databases. Critical Care Time devoted to patient care services described in this note independent of APP/resident time (if applicable)  is 30 minutes.   Sherrilyn Rist MD [place] Personal pager: [phone] If unanswered, please page CCM On-call: [phone]

## 2020-02-28 NOTE — Progress Notes (Signed)
ANTICOAGULATION CONSULT NOTE - Follow Up Consult  Pharmacy Consult for Heparin Indication: pulmonary embolus  No Known Allergies  Patient Measurements: Height: 5\' 7"  (170.2 cm) Weight: (!) 327 lb 13.2 oz (148.7 kg) IBW/kg (Calculated) : 61.6 Heparin Dosing Weight:  98.3 kg  Vital Signs: Temp: 97.6 F (36.4 C) (03/01 0800) Temp Source: Oral (03/01 0800) BP: 105/80 (03/01 1130) Pulse Rate: 81 (03/01 1130)  Labs: Recent Labs    02/27/20 1025 02/27/20 1026 02/27/20 2108 02/28/20 0514 02/28/20 1215  HGB  --  15.0  --  13.5  --   HCT  --  47.7*  --  42.4  --   PLT  --  270  --  220  --   APTT 25  --  43*  --   --   LABPROT 13.1  --   --  16.7*  --   INR 1.0  --   --  1.4*  --   HEPARINUNFRC  --   --   --  0.38 0.35  CREATININE  --  0.88  --  0.95  --   TROPONINIHS  --   --   --  54*  --     Estimated Creatinine Clearance: 104.2 mL/min (by C-G formula based on SCr of 0.95 mg/dL).   Assessment:   Anticoag: Heparin for PE w/ RHS, s/p tPA on 2/28 at 1417, d-dimer 0.76. Hep level 0.38 remains in goal range 0.35.  Goal of Therapy:  Heparin level 0.3-0.5 through today units/ml Monitor platelets by anticoagulation protocol: Yes   Plan:  Heparin gtt at 1400 units/hr (0.3-0.5 trough 3/1) Daily heparin level and CBC   Susan Robertson S. 3/28, PharmD, BCPS Clinical Staff Pharmacist [email or web address] Merilynn Finland, Saliah Crisp Stillinger 02/28/2020,1:07 PM

## 2020-02-28 NOTE — Progress Notes (Signed)
ANTICOAGULATION CONSULT NOTE   Pharmacy Consult for Heparin Indication: pulmonary embolus  No Known Allergies  Patient Measurements: Height: 5\' 7"  (170.2 cm) Weight: (!) 327 lb 13.2 oz (148.7 kg) IBW/kg (Calculated) : 61.6 Heparin Dosing Weight: 98.3 kg   Vital Signs: Temp: 97.8 F (36.6 C) (03/01 0319) Temp Source: Oral (03/01 0319) BP: 97/73 (03/01 0500) Pulse Rate: 65 (03/01 0500)  Labs: Recent Labs    02/27/20 1025 02/27/20 1026 02/27/20 2108 02/28/20 0514  HGB  --  15.0  --  13.5  HCT  --  47.7*  --  42.4  PLT  --  270  --  220  APTT 25  --  43*  --   LABPROT 13.1  --   --   --   INR 1.0  --   --   --   HEPARINUNFRC  --   --   --  0.38  CREATININE  --  0.88  --   --     Estimated Creatinine Clearance: 112.5 mL/min (by C-G formula based on SCr of 0.88 mg/dL).   Medical History: Past Medical History:  Diagnosis Date  . Arthritis   . Joint pain   . Sleep apnea    in process of getting CPAP.    Medications:  Scheduled:  . Chlorhexidine Gluconate Cloth  6 each Topical Daily  . pantoprazole  40 mg Oral Daily    Assessment: 54 yof with recent COVID infection presenting with new onset SOB and hypoxemia - found to have PE with RHS. Received alteplase on 2/28 at 1417.   Hgb 15, plt 270. No s/sx of bleeding. Had difficulties obtaining lab draws after multiple attempts- once aPTT drawn it resulted at 43 - okay to start heparin.    3/1 AM update:  Initial heparin level therapeutic CBC remains ok s/p alteplase   Goal of Therapy:  Heparin level 0.3-0.5 units/ml for first 24 hours then 0.3-0.7 Monitor platelets by anticoagulation protocol: Yes   Plan:  Cont heparin at 1400 units/hr Confirmatory heparin level at 1200  3/28, PharmD, BCPS Clinical Pharmacist Phone: [phone]

## 2020-02-28 NOTE — Progress Notes (Signed)
eLink Physician-Brief Progress Note Patient Name: Susan Robertson DOB: 1966-08-10 MRN: [id number]   Date of Service  02/28/2020  HPI/Events of Note  Notified of patient with mild pain on calf  eICU Interventions  Tylenol 650 mg PO prn ordered     Intervention Category Intermediate Interventions: Pain - evaluation and management  Darl Pikes 02/28/2020, 1:28 AM

## 2020-02-29 DIAGNOSIS — I2699 Other pulmonary embolism without acute cor pulmonale: Principal | ICD-10-CM

## 2020-02-29 LAB — CBC WITH DIFFERENTIAL/PLATELET
Abs Immature Granulocytes: 0.07 10*3/uL (ref 0.00–0.07)
Basophils Absolute: 0 10*3/uL (ref 0.0–0.1)
Basophils Relative: 0 %
Eosinophils Absolute: 0.2 10*3/uL (ref 0.0–0.5)
Eosinophils Relative: 2 %
HCT: 41.8 % (ref 36.0–46.0)
Hemoglobin: 13.2 g/dL (ref 12.0–15.0)
Immature Granulocytes: 1 %
Lymphocytes Relative: 24 %
Lymphs Abs: 2.2 10*3/uL (ref 0.7–4.0)
MCH: 27.6 pg (ref 26.0–34.0)
MCHC: 31.6 g/dL (ref 30.0–36.0)
MCV: 87.4 fL (ref 80.0–100.0)
Monocytes Absolute: 0.6 10*3/uL (ref 0.1–1.0)
Monocytes Relative: 7 %
Neutro Abs: 6 10*3/uL (ref 1.7–7.7)
Neutrophils Relative %: 66 %
Platelets: 172 10*3/uL (ref 150–400)
RBC: 4.78 MIL/uL (ref 3.87–5.11)
RDW: 14.3 % (ref 11.5–15.5)
WBC: 9 10*3/uL (ref 4.0–10.5)
nRBC: 0 % (ref 0.0–0.2)

## 2020-02-29 LAB — PHOSPHORUS: Phosphorus: 4.1 mg/dL (ref 2.5–4.6)

## 2020-02-29 LAB — HEPARIN LEVEL (UNFRACTIONATED)
Heparin Unfractionated: 0.19 IU/mL — ABNORMAL LOW (ref 0.30–0.70)
Heparin Unfractionated: 0.38 IU/mL (ref 0.30–0.70)

## 2020-02-29 LAB — MAGNESIUM: Magnesium: 2.2 mg/dL (ref 1.7–2.4)

## 2020-02-29 NOTE — Progress Notes (Signed)
ANTICOAGULATION CONSULT NOTE - Follow Up Consult  Pharmacy Consult for Heparin Indication: pulmonary embolus  No Known Allergies  Patient Measurements: Height: 5\' 7"  (170.2 cm) Weight: (!) 327 lb 13.2 oz (148.7 kg) IBW/kg (Calculated) : 61.6 Heparin Dosing Weight:  98.3 kg  Vital Signs: Temp: 98.2 F (36.8 C) (03/02 1450) Temp Source: Oral (03/02 1450) BP: 109/63 (03/02 1450) Pulse Rate: 86 (03/02 1235)  Labs: Recent Labs    02/27/20 1025 02/27/20 1026 02/27/20 1026 02/27/20 2108 02/28/20 0514 02/28/20 0514 02/28/20 1215 02/29/20 0647 02/29/20 1501  HGB  --  15.0   < >  --  13.5  --   --  13.2  --   HCT  --  47.7*  --   --  42.4  --   --  41.8  --   PLT  --  270  --   --  220  --   --  172  --   APTT 25  --   --  43*  --   --   --   --   --   LABPROT 13.1  --   --   --  16.7*  --   --   --   --   INR 1.0  --   --   --  1.4*  --   --   --   --   HEPARINUNFRC  --   --   --   --  0.38   < > 0.35 0.19* 0.38  CREATININE  --  0.88  --   --  0.95  --   --   --   --   TROPONINIHS  --   --   --   --  54*  --   --   --   --    < > = values in this interval not displayed.    Estimated Creatinine Clearance: 104.2 mL/min (by C-G formula based on SCr of 0.95 mg/dL).   Assessment: 54 yo female on heparin for PE w/ RHS, s/p tPA on 2/28 at 1417. Plans noted for oral anticoagulation later this week -heparin level at goal   Goal of Therapy:  Heparin level= 0.3-0.7 Monitor platelets by anticoagulation protocol: Yes   Plan: No heparin changes needed Daily heparin level and CBC  3/28, PharmD Clinical Pharmacist **Pharmacist phone directory can now be found on [email or web address] ([email or web address]).  Listed under [facility].

## 2020-02-29 NOTE — Progress Notes (Addendum)
PROGRESS NOTE                                                                                                                                                                                                             Patient Demographics:    Susan Robertson, is a 54 y.o. female, DOB - Apr 17, 1966, ZYS:[id number]  Admit date - 02/27/2020   Admitting Physician Brand Males, MD  Outpatient Primary MD for the patient is Asencion Noble, MD  LOS - 2   Chief Complaint  Patient presents with  . Shortness of Breath       Brief Narrative     54 year old obese female with a history of sleep apnea initially presented to the hospital service?  At [facility] on 02/16/2020 with a 5-day history of shortness of breath and cough and hypoxemia with home testing and a positive home Covid test 2 days early on 02/14/2020.  At admission chest x-ray showed features of viral pneumonia.  She was given remdesivir for 3 days and and oral Decadron.  She was discharged 2 days later on 02/18/2020 with instructions to finish her remdesivir as an outpatient.  Her last remdesivir infusion was on 02/20/2020  She represented to [facility] emergency room 02/27/2020 with new onset shortness of breath since the night before and hypoxemia of 87% on room air.  In the ER her respiratory rate fluctuated anywhere between 24-31/min.  Her blood pressure consistently remained  [id number] systolic she improved requiring 4-6 L but was still labored and then required a nonrebreather per chart review.  She was afebrile.  Her heart rate was between 90-100/min.  CT angiogram showed RV-LV ratio of 2.1 and right pulmonary artery and lobar level pulmonary embolism.  There was RV strain.  She received alteplase 100 mg IV and was transferred to [facility] bed 3 and 14 for further monitoring.  Critical care medicine is admitting. Upon arrival to the ICU at [facility] she says she is  beginning to feel better.  She is on IV heparin therapy currently.  No active bleeding and mentating fine.     Subjective:    Susan Robertson today has, No headache, No chest pain, No abdominal pain - No Nausea, No new weakness tingling or numbness, No Cough - SOB.    Assessment  &  Plan :    Active Problems:   Pulmonary emboli (HCC)  Submassive pulmonary embolism  - status post TPA on Admission -Continue with neurochecks. -Continue with IV heparin for next 1 to 4 days, then switch to oral anticoagulation. -risk factor  For PE is  COVID-19 2 weeks ago , morbid obesity and sedentary -Keep pulse ox greater than 88% -Monitor for bleeding -Avoid arterial sticks -Will check venous Doppler as she reports left calf pain -Encouraged to use incentive spirometry, flutter valve, and out of bed to chair.  sleep apnea - cpap QHS in hospital  Pneumonia due to COVID-19 virus -So far no hypoxia, no indication for steroids or remdesivir  Obesity   COVID-19 Labs  No results for input(s): DDIMER, FERRITIN, LDH, CRP in the last 72 hours.  Lab Results  Component Value Date   SARSCOV2NAA Detected (A) 02/14/2020     Code Status : Full  Family Communication  : D/W patient  Disposition Plan  : Home  Barriers For Discharge : on Heparin drip.  Consults  :  PCCM  Procedures  : None  DVT Prophylaxis  :  Heparin gtt  Lab Results  Component Value Date   PLT 172 02/29/2020    Antibiotics  :    Anti-infectives (From admission, onward)   None        Objective:   Vitals:   02/29/20 0600 02/29/20 0633 02/29/20 0634 02/29/20 1235  BP:   (!) 93/59 132/79  Pulse:   76 86  Resp:  18  18  Temp: 98.9 F (37.2 C)  98.9 F (37.2 C) 98 F (36.7 C)  TempSrc:    Oral  SpO2:   99% 96%  Weight:      Height:        Wt Readings from Last 3 Encounters:  02/28/20 (!) 148.7 kg  02/17/20 (!) 152.7 kg  12/07/18 (!) 149.7 kg     Intake/Output Summary (Last 24 hours) at 02/29/2020  1416 Last data filed at 02/29/2020 0600 Gross per 24 hour  Intake --  Output 800 ml  Net -800 ml     Physical Exam  Awake Alert, Oriented X 3, No new F.N deficits, Normal affect Symmetrical Chest wall movement, Good air movement bilaterally, CTAB RRR,No Gallops,Rubs or new Murmurs, No Parasternal Heave +ve B.Sounds, Abd Soft, No tenderness, No rebound - guarding or rigidity. No Cyanosis, Clubbing or edema, No new Rash or bruise      Data Review:    CBC Recent Labs  Lab 02/27/20 1026 02/28/20 0514 02/29/20 0647  WBC 13.9* 9.3 9.0  HGB 15.0 13.5 13.2  HCT 47.7* 42.4 41.8  PLT 270 220 172  MCV 88.3 87.1 87.4  MCH 27.8 27.7 27.6  MCHC 31.4 31.8 31.6  RDW 14.3 14.4 14.3  LYMPHSABS 2.3 2.5 2.2  MONOABS 0.8 0.7 0.6  EOSABS 0.1 0.1 0.2  BASOSABS 0.0 0.0 0.0    Chemistries  Recent Labs  Lab 02/27/20 1026 02/28/20 0514 02/29/20 0647  NA 137 139  --   K 4.2 3.8  --   CL 100 103  --   CO2 25 24  --   GLUCOSE 120* 143*  --   BUN 20 16  --   CREATININE 0.88 0.95  --   CALCIUM 8.9 8.6*  --   MG  --  2.3 2.2  AST 24 21  --   ALT 37 28  --   ALKPHOS 66 56  --  BILITOT 0.9 0.9  --    ------------------------------------------------------------------------------------------------------------------ No results for input(s): CHOL, HDL, LDLCALC, TRIG, CHOLHDL, LDLDIRECT in the last 72 hours.  No results found for: HGBA1C ------------------------------------------------------------------------------------------------------------------ No results for input(s): TSH, T4TOTAL, T3FREE, THYROIDAB in the last 72 hours.  Invalid input(s): FREET3 ------------------------------------------------------------------------------------------------------------------ No results for input(s): VITAMINB12, FOLATE, FERRITIN, TIBC, IRON, RETICCTPCT in the last 72 hours.  Coagulation profile Recent Labs  Lab 02/27/20 1025 02/28/20 0514  INR 1.0 1.4*    No results for input(s): DDIMER  in the last 72 hours.  Cardiac Enzymes No results for input(s): CKMB, TROPONINI, MYOGLOBIN in the last 168 hours.  Invalid input(s): CK ------------------------------------------------------------------------------------------------------------------ No results found for: BNP  Inpatient Medications  Scheduled Meds: . Chlorhexidine Gluconate Cloth  6 each Topical Daily  . pantoprazole  40 mg Oral Daily   Continuous Infusions: . heparin 1,700 Units/hr (02/29/20 1115)   PRN Meds:.acetaminophen  Micro Results Recent Results (from the past 240 hour(s))  Urine culture     Status: None   Collection Time: 02/27/20  2:13 PM   Specimen: Urine, Catheterized  Result Value Ref Range Status   Specimen Description   Final    URINE, CATHETERIZED Performed at [facility], [address], [place], [place] [address]    Special Requests   Final    Normal Performed at [facility], [address], [place], [place] [address]    Culture   Final    NO GROWTH Performed at [facility], [address], [place], [place] [address]    Report Status 02/28/2020 FINAL  Final  MRSA PCR Screening     Status: None   Collection Time: 02/27/20  5:15 PM   Specimen: Nasopharyngeal  Result Value Ref Range Status   MRSA by PCR NEGATIVE NEGATIVE Final    Comment:        The GeneXpert MRSA Assay (FDA approved for NASAL specimens only), is one component of a comprehensive MRSA colonization surveillance program. It is not intended to diagnose MRSA infection nor to guide or monitor treatment for MRSA infections. Performed at [facility], [address] [place], [place] [address]     Radiology Reports CT Angio Chest PE W and/or Wo Contrast  Result Date: 02/27/2020 CLINICAL DATA:  Shortness of breath, positive COVID test 2 weeks ago EXAM: CT ANGIOGRAPHY CHEST WITH CONTRAST TECHNIQUE: Multidetector CT imaging of the chest was performed using the standard protocol during bolus administration  of intravenous contrast. Multiplanar CT image reconstructions and MIPs were obtained to evaluate the vascular anatomy. CONTRAST:  OMNIPAQUE IOHEXOL 350 MG/ML SOLN COMPARISON:  Chest x-ray 02/26/2020 FINDINGS: Cardiovascular: Signs of bilateral lobar level emboli and embolus in the right main pulmonary artery. Emboli extend into upper and lower lobe branches on the right and left. Signs of right heart strain with RV to LV ratio of approximately 2.1. Thoracic aorta is normal. No signs of pericardial effusion. Mediastinum/Nodes: No signs of axillary adenopathy. Thoracic inlet structures are normal. No signs of mediastinal or hilar adenopathy. Esophagus is normal. Lungs/Pleura: Basilar atelectasis. Airways are patent. Tiny nodule at the left lung base measuring 3 mm (image 92, series 7) no signs of pleural effusion. Tiny pulmonary nodule in the right upper lobe measuring approximately 3 mm (image 53, series 7) Upper Abdomen: Incidental imaging of upper abdominal contents shows lobulated hepatic contour with low-density lesion in the right hepatic lobe measuring measuring water density at 2.4 x 1.8 cm. Lobular hepatic contours with suggestion of  background hepatic steatosis. Musculoskeletal: Spinal degenerative changes without acute or destructive bone process. Review of the MIP images confirms the above findings. IMPRESSION: 1. Signs of bilateral lobar level pulmonary emboli and embolus in the right main pulmonary artery. Signs of right heart strain with RV to LV ratio of approximately 2.1. Large volume of pulmonary emboli in the lungs bilaterally, with dilatation of the right atrium and right ventricle (RV to LV ratioof 2.1) indicative of elevated right-sided heart pressures and right heart strain. These findings have been shown to be associated with a increased morbidity and mortality in the setting pulmonary embolism. Critical Value/emergent results were called by telephone at the time of interpretation on  02/27/2020 at 1:22 pm to provider [facility] , who verbally acknowledged these results. 2. Low-density lesion in the dome of the liver is likely a cyst, not well evaluated on the current study. Ultrasound follow-up is suggested in this patient with hepatic steatosis and lobular hepatic contours. 3. Tiny pulmonary nodules in the right upper lobe and left lung base. No follow-up needed if patient is low-risk (and has no known or suspected primary neoplasm). Non-contrast chest CT can be considered in 12 months if patient is high-risk. This recommendation follows the consensus statement: Guidelines for Management of Incidental Pulmonary Nodules Detected on CT Images:From the Fleischner Society 2017; published online before print ( ID Festus Barren. ID ). Electronically Signed   By: Donzetta Kohut M.D.   On: 02/27/2020 13:28   DG Chest Portable 1 View  Result Date: 02/16/2020 CLINICAL DATA:  Hypoxia, COVID-19 positive EXAM: PORTABLE CHEST 1 VIEW COMPARISON:  None. FINDINGS: The heart size and mediastinal contours are within normal limits. Subtle hazy opacities within the peripheral aspects of the mid lung fields bilaterally. No pleural effusion or pneumothorax. The visualized skeletal structures are unremarkable. IMPRESSION: Subtle hazy opacities within the peripheral aspects of the mid lung fields bilaterally. Findings suspicious for early viral pneumonia. Electronically Signed   By: Duanne Guess D.O.   On: 02/16/2020 14:29      Huey Bienenstock M.D on 02/29/2020 at 2:16 PM  Between 7am to 7pm - Pager - [phone]  After 7pm go to [email or web address] - password [employer]  [facility] -  Office  [phone]

## 2020-02-29 NOTE — Progress Notes (Signed)
ANTICOAGULATION CONSULT NOTE - Follow Up Consult  Pharmacy Consult for Heparin Indication: pulmonary embolus  No Known Allergies  Patient Measurements: Height: 5\' 7"  (170.2 cm) Weight: (!) 327 lb 13.2 oz (148.7 kg) IBW/kg (Calculated) : 61.6 Heparin Dosing Weight:  98.3 kg  Vital Signs: Temp: 98.9 F (37.2 C) (03/02 0634) Temp Source: Axillary (03/01 2356) BP: 93/59 (03/02 0634) Pulse Rate: 76 (03/02 0634)  Labs: Recent Labs    02/27/20 1025 02/27/20 1026 02/27/20 1026 02/27/20 2108 02/28/20 0514 02/28/20 1215 02/29/20 0647  HGB  --  15.0   < >  --  13.5  --  13.2  HCT  --  47.7*  --   --  42.4  --  41.8  PLT  --  270  --   --  220  --  172  APTT 25  --   --  43*  --   --   --   LABPROT 13.1  --   --   --  16.7*  --   --   INR 1.0  --   --   --  1.4*  --   --   HEPARINUNFRC  --   --   --   --  0.38 0.35 0.19*  CREATININE  --  0.88  --   --  0.95  --   --   TROPONINIHS  --   --   --   --  54*  --   --    < > = values in this interval not displayed.    Estimated Creatinine Clearance: 104.2 mL/min (by C-G formula based on SCr of 0.95 mg/dL).   Assessment:  Anticoag: Heparin for PE w/ RHS, s/p tPA on 2/28 at 1417, d-dimer 0.76. Hep level 0.19 low this AM. Hgb stable and WNL. Watch plts 270>172 this AM  Goal of Therapy:  Heparin level 0.3-0.5 through today units/ml Monitor platelets by anticoagulation protocol: Yes   Plan:  Increase IV heparin to 1700 units/hr. Recheck level in 6 hrs. Daily heparin level and CBC Pulmonary notes: Continue IV heparin and continue this for the next 2-5 days and then switch to oral anticoagulation   Vandana Haman S. 3/28, PharmD, BCPS Clinical Staff Pharmacist [email or web address] Merilynn Finland, Merilynn Finland 02/29/2020,8:53 AM

## 2020-03-01 ENCOUNTER — Inpatient Hospital Stay (HOSPITAL_COMMUNITY): Payer: BC Managed Care – PPO

## 2020-03-01 DIAGNOSIS — I2699 Other pulmonary embolism without acute cor pulmonale: Secondary | ICD-10-CM

## 2020-03-01 LAB — CBC WITH DIFFERENTIAL/PLATELET
Abs Immature Granulocytes: 0.06 10*3/uL (ref 0.00–0.07)
Basophils Absolute: 0 10*3/uL (ref 0.0–0.1)
Basophils Relative: 0 %
Eosinophils Absolute: 0.2 10*3/uL (ref 0.0–0.5)
Eosinophils Relative: 2 %
HCT: 38.2 % (ref 36.0–46.0)
Hemoglobin: 12.3 g/dL (ref 12.0–15.0)
Immature Granulocytes: 1 %
Lymphocytes Relative: 25 %
Lymphs Abs: 2.2 10*3/uL (ref 0.7–4.0)
MCH: 28.3 pg (ref 26.0–34.0)
MCHC: 32.2 g/dL (ref 30.0–36.0)
MCV: 87.8 fL (ref 80.0–100.0)
Monocytes Absolute: 0.6 10*3/uL (ref 0.1–1.0)
Monocytes Relative: 6 %
Neutro Abs: 6 10*3/uL (ref 1.7–7.7)
Neutrophils Relative %: 66 %
Platelets: 221 10*3/uL (ref 150–400)
RBC: 4.35 MIL/uL (ref 3.87–5.11)
RDW: 14.1 % (ref 11.5–15.5)
WBC: 9 10*3/uL (ref 4.0–10.5)
nRBC: 0 % (ref 0.0–0.2)

## 2020-03-01 LAB — BASIC METABOLIC PANEL
Anion gap: 13 (ref 5–15)
BUN: 12 mg/dL (ref 6–20)
CO2: 23 mmol/L (ref 22–32)
Calcium: 8.7 mg/dL — ABNORMAL LOW (ref 8.9–10.3)
Chloride: 103 mmol/L (ref 98–111)
Creatinine, Ser: 0.84 mg/dL (ref 0.44–1.00)
GFR calc Af Amer: >60 mL/min (ref >60)
GFR calc non Af Amer: >60 mL/min (ref >60)
Glucose, Bld: 121 mg/dL — ABNORMAL HIGH (ref 70–99)
Potassium: 4.1 mmol/L (ref 3.5–5.1)
Sodium: 139 mmol/L (ref 135–145)

## 2020-03-01 LAB — PHOSPHORUS: Phosphorus: 4.1 mg/dL (ref 2.5–4.6)

## 2020-03-01 LAB — MAGNESIUM: Magnesium: 2.3 mg/dL (ref 1.7–2.4)

## 2020-03-01 LAB — HEPARIN LEVEL (UNFRACTIONATED): Heparin Unfractionated: 0.46 IU/mL (ref 0.30–0.70)

## 2020-03-01 MED ORDER — PANTOPRAZOLE SODIUM 40 MG PO TBEC: 40.0000 mg | DELAYED_RELEASE_TABLET | Freq: Every day | ORAL | Status: DC

## 2020-03-01 MED ORDER — APIXABAN 5 MG PO TABS: 5.0000 mg | ORAL_TABLET | Freq: Two times a day (BID) | ORAL | Status: DC

## 2020-03-01 MED ORDER — ALBUTEROL SULFATE HFA 108 (90 BASE) MCG/ACT IN AERS
2.0000 | INHALATION_SPRAY | Freq: Four times a day (QID) | RESPIRATORY_TRACT | Status: DC | PRN
  Filled 2020-03-01: qty 6.7

## 2020-03-01 MED ORDER — ONDANSETRON HCL 4 MG/2ML IJ SOLN: 4.0000 mg | Freq: Four times a day (QID) | INTRAMUSCULAR | Status: DC | PRN

## 2020-03-01 MED ORDER — APIXABAN 5 MG PO TABS
10.0000 mg | ORAL_TABLET | ORAL | Status: AC
  Administered 2020-03-01: 14:00:00 10 mg via ORAL
  Filled 2020-03-01: qty 2

## 2020-03-01 MED ORDER — APIXABAN 5 MG PO TABS
10.0000 mg | ORAL_TABLET | Freq: Two times a day (BID) | ORAL | Status: DC
  Administered 2020-03-01 – 2020-03-02 (×2): 10 mg via ORAL
  Filled 2020-03-01 (×2): qty 2

## 2020-03-01 NOTE — Discharge Instructions (Addendum)
Person Under Monitoring Name: Susan Robertson  Location: [place] [address]   Infection Prevention Recommendations for Individuals Confirmed to have, or Being Evaluated for, 2019 Novel Coronavirus (COVID-19) Infection Who Receive Care at Home  Individuals who are confirmed to have, or are being evaluated for, COVID-19 should follow the prevention steps below until a healthcare provider or local or state health department says they can return to normal activities.  Stay home except to get medical care You should restrict activities outside your home, except for getting medical care. Do not go to work, school, or public areas, and do not use public transportation or taxis.  Call ahead before visiting your doctor Before your medical appointment, call the healthcare provider and tell them that you have, or are being evaluated for, COVID-19 infection. This will help the healthcare provider's office take steps to keep other people from getting infected. Ask your healthcare provider to call the local or state health department.  Monitor your symptoms Seek prompt medical attention if your illness is worsening (e.g., difficulty breathing). Before going to your medical appointment, call the healthcare provider and tell them that you have, or are being evaluated for, COVID-19 infection. Ask your healthcare provider to call the local or state health department.  Wear a facemask You should wear a facemask that covers your nose and mouth when you are in the same room with other people and when you visit a healthcare provider. People who live with or visit you should also wear a facemask while they are in the same room with you.  Separate yourself from other people in your home As much as possible, you should stay in a different room from other people in your home. Also, you should use a separate bathroom, if available.  Avoid sharing household items You should not  share dishes, drinking glasses, cups, eating utensils, towels, bedding, or other items with other people in your home. After using these items, you should wash them thoroughly with soap and water.  Cover your coughs and sneezes Cover your mouth and nose with a tissue when you cough or sneeze, or you can cough or sneeze into your sleeve. Throw used tissues in a lined trash can, and immediately wash your hands with soap and water for at least 20 seconds or use an alcohol-based hand rub.  Wash your [facility] your hands often and thoroughly with soap and water for at least 20 seconds. You can use an alcohol-based hand sanitizer if soap and water are not available and if your hands are not visibly dirty. Avoid touching your eyes, nose, and mouth with unwashed hands.   Prevention Steps for Caregivers and Household Members of Individuals Confirmed to have, or Being Evaluated for, COVID-19 Infection Being Cared for in the Home  If you live with, or provide care at home for, a person confirmed to have, or being evaluated for, COVID-19 infection please follow these guidelines to prevent infection:  Follow healthcare provider's instructions Make sure that you understand and can help the patient follow any healthcare provider instructions for all care.  Provide for the patient's basic needs You should help the patient with basic needs in the home and provide support for getting groceries, prescriptions, and other personal needs.  Monitor the patient's symptoms If they are getting sicker, call his or her medical provider and tell them that the patient has, or is being evaluated for, COVID-19 infection. This will help  the healthcare provider's office take steps to keep other people from getting infected. Ask the healthcare provider to call the local or state health department.  Limit the number of people who have contact with the patient  If possible, have only one caregiver for the  patient.  Other household members should stay in another home or place of residence. If this is not possible, they should stay  in another room, or be separated from the patient as much as possible. Use a separate bathroom, if available.  Restrict visitors who do not have an essential need to be in the home.  Keep older adults, very young children, and other sick people away from the patient Keep older adults, very young children, and those who have compromised immune systems or chronic health conditions away from the patient. This includes people with chronic heart, lung, or kidney conditions, diabetes, and cancer.  Ensure good ventilation Make sure that shared spaces in the home have good air flow, such as from an air conditioner or an opened window, weather permitting.  Wash your hands often  Wash your hands often and thoroughly with soap and water for at least 20 seconds. You can use an alcohol based hand sanitizer if soap and water are not available and if your hands are not visibly dirty.  Avoid touching your eyes, nose, and mouth with unwashed hands.  Use disposable paper towels to dry your hands. If not available, use dedicated cloth towels and replace them when they become wet.  Wear a facemask and gloves  Wear a disposable facemask at all times in the room and gloves when you touch or have contact with the patient's blood, body fluids, and/or secretions or excretions, such as sweat, saliva, sputum, nasal mucus, vomit, urine, or feces.  Ensure the mask fits over your nose and mouth tightly, and do not touch it during use.  Throw out disposable facemasks and gloves after using them. Do not reuse.  Wash your hands immediately after removing your facemask and gloves.  If your personal clothing becomes contaminated, carefully remove clothing and launder. Wash your hands after handling contaminated clothing.  Place all used disposable facemasks, gloves, and other waste in a lined  container before disposing them with other household waste.  Remove gloves and wash your hands immediately after handling these items.  Do not share dishes, glasses, or other household items with the patient  Avoid sharing household items. You should not share dishes, drinking glasses, cups, eating utensils, towels, bedding, or other items with a patient who is confirmed to have, or being evaluated for, COVID-19 infection.  After the person uses these items, you should wash them thoroughly with soap and water.  Wash laundry thoroughly  Immediately remove and wash clothes or bedding that have blood, body fluids, and/or secretions or excretions, such as sweat, saliva, sputum, nasal mucus, vomit, urine, or feces, on them.  Wear gloves when handling laundry from the patient.  Read and follow directions on labels of laundry or clothing items and detergent. In general, wash and dry with the warmest temperatures recommended on the label.  Clean all areas the individual has used often  Clean all touchable surfaces, such as counters, tabletops, doorknobs, bathroom fixtures, toilets, phones, keyboards, tablets, and bedside tables, every day. Also, clean any surfaces that may have blood, body fluids, and/or secretions or excretions on them.  Wear gloves when cleaning surfaces the patient has come in contact with.  Use a diluted bleach solution (e.g., dilute  bleach with 1 part bleach and 10 parts water) or a household disinfectant with a label that says EPA-registered for coronaviruses. To make a bleach solution at home, add 1 tablespoon of bleach to 1 quart (4 cups) of water. For a larger supply, add  cup of bleach to 1 gallon (16 cups) of water.  Read labels of cleaning products and follow recommendations provided on product labels. Labels contain instructions for safe and effective use of the cleaning product including precautions you should take when applying the product, such as wearing gloves or  eye protection and making sure you have good ventilation during use of the product.  Remove gloves and wash hands immediately after cleaning.  Monitor yourself for signs and symptoms of illness Caregivers and household members are considered close contacts, should monitor their health, and will be asked to limit movement outside of the home to the extent possible. Follow the monitoring steps for close contacts listed on the symptom monitoring form.   ? If you have additional questions, contact your local health department or call the epidemiologist on call at [phone] (available 24/7). ? This guidance is subject to change. For the most up-to-date guidance from [facility], please refer to their website: [email or web address].tn on my medicine - ELIQUIS (apixaban)  This medication education was reviewed with me or my healthcare representative as part of my discharge preparation.  The pharmacist that spoke with me during my hospital stay was:  Arvilla Meres, [facility]  Why was Eliquis prescribed for you? Eliquis was prescribed to treat blood clots that may have been found in the veins of your legs (deep vein thrombosis) or in your lungs (pulmonary embolism) and to reduce the risk of them occurring again.  What do You need to know about Eliquis ? The starting dose is 10 mg (two 5 mg tablets) taken TWICE daily for the FIRST SEVEN (7) DAYS, then on 03/08/20  the dose is reduced to ONE 5 mg tablet taken TWICE daily.  Eliquis may be taken with or without food.   Try to take the dose about the same time in the morning and in the evening. If you have difficulty swallowing the tablet whole please discuss with your pharmacist how to take the medication safely.  Take Eliquis exactly as prescribed and DO NOT stop taking Eliquis without talking to the doctor who prescribed the medication.  Stopping may increase your risk of developing a new blood  clot.  Refill your prescription before you run out.  After discharge, you should have regular check-up appointments with your healthcare provider that is prescribing your Eliquis.    What do you do if you miss a dose? If a dose of ELIQUIS is not taken at the scheduled time, take it as soon as possible on the same day and twice-daily administration should be resumed. The dose should not be doubled to make up for a missed dose.  Important Safety Information A possible side effect of Eliquis is bleeding. You should call your healthcare provider right away if you experience any of the following: ? Bleeding from an injury or your nose that does not stop. ? Unusual colored urine (red or dark brown) or unusual colored stools (red or black). ? Unusual bruising for unknown reasons. ? A serious fall or if you hit your head (even if there is no bleeding).  Some medicines may interact with Eliquis and might increase your risk of bleeding or clotting while on Eliquis. To help avoid this, consult your  healthcare provider or pharmacist prior to using any new prescription or non-prescription medications, including herbals, vitamins, non-steroidal anti-inflammatory drugs (NSAIDs) and supplements.  This website has more information on Eliquis (apixaban): http://www.eliquis.com/eliquis/home

## 2020-03-01 NOTE — Progress Notes (Signed)
ANTICOAGULATION CONSULT NOTE - Follow Up Consult  Pharmacy Consult for Heparin>>Eliquis Indication: pulmonary embolus  No Known Allergies  Patient Measurements: Height: 5\' 7"  (170.2 cm) Weight: (!) 327 lb 13.2 oz (148.7 kg) IBW/kg (Calculated) : 61.6 Heparin Dosing Weight:  98.3 kg  Vital Signs: Temp: 98.6 F (37 C) (03/03 1150) Temp Source: Oral (03/03 1150) BP: 128/77 (03/03 1150) Pulse Rate: 91 (03/03 1000)  Labs: Recent Labs     0000 02/27/20 2108 02/28/20 0514 02/28/20 1215 02/29/20 0647 02/29/20 1501 03/01/20 0406  HGB   < >  --  13.5  --  13.2  --  12.3  HCT  --   --  42.4  --  41.8  --  38.2  PLT  --   --  220  --  172  --  221  APTT  --  43*  --   --   --   --   --   LABPROT  --   --  16.7*  --   --   --   --   INR  --   --  1.4*  --   --   --   --   HEPARINUNFRC  --   --  0.38   < > 0.19* 0.38 0.46  CREATININE  --   --  0.95  --   --   --  0.84  TROPONINIHS  --   --  54*  --   --   --   --    < > = values in this interval not displayed.    Estimated Creatinine Clearance: 117.9 mL/min (by C-G formula based on SCr of 0.84 mg/dL).   Assessment:   Anticoag: Heparin for PE w/ RHS, s/p tPA on 2/28 at 1417, d-dimer 0.76. Hep level 0.46 in goal. Hgb 12.3 dropping. Plts 221 ok. Discussed with Dr. 3/28 with plan to transition to [facility] today. - Pulmonary notes 3/1: Continue IV heparin and continue this for the next 2-5 days and then switch to oral anticoagulation  Goal of Therapy:  Heparin level 0.3-0.5 through today units/ml Monitor platelets by anticoagulation protocol: Yes   Plan:  D/c IV heparin Eliquis 10mg  BID x7d then 5mg  BID   Caoimhe Damron [facility], PharmD, BCPS Clinical Staff Pharmacist [email or web address] , 03/01/2020,1:47 PM

## 2020-03-01 NOTE — Progress Notes (Addendum)
PROGRESS NOTE                                                                                                                                                                                                             Patient Demographics:    Susan Robertson, is a 54 y.o. female, DOB - 1966-03-11, ZOX:[id number]  Outpatient Primary MD for the patient is Carylon PerchesFagan, Roy, MD   Admit date - 02/27/2020   LOS - 3  Chief Complaint  Patient presents with  . Shortness of Breath       Brief Narrative: Patient is a 54 y.o. female with PMHx of OSA-recent history of COVID-19 infection requiring hospitalization from 2/17-2/19-completed remdesivir infusion as an outpatient-presented to [facility] ED on 2/28 with shortness of breath-further work-up revealed a large pulmonary embolism with RV strain.  She was treated with TPA-and [facility].  Upon clinical stability-she was transferred to the [facility]ist service on 3/2.  Significant Events: 2/17-2/19>> [facility]. 2/28>> presented to [facility] ED with shortness of breath-large PE with RV strain-received systemic TPA. 2/28>> [facility] 2/28>> started on IV heparin 3/2>> transferred to [facility] hospitalist service.  Microbiology data: 2/28>> urine culture: Negative 2/17>> blood culture: Negative   Subjective:    Susan Robertson today feels much better.  She is on room air.  She complains of some left calf pain but no swelling in the legs.  She does not have chest pain when she takes deep breaths.   Assessment  & Plan :   Acute hypoxic respiratory failure secondary to submassive pulmonary embolism with RV strain: S/p TPA on admission-has been on IV heparin infusion since 2/28.  Bilateral lower extremity Doppler negative for DVT (prelim).  Has been on room air-looks very stable-switch to Eliquis today (patient does not want Coumadin due to frequent  blood checks).  She is aware that she needs follow-up with a hematologist in the outpatient setting to determine optimal duration of anticoagulation-for now she needs it for at least 6-9 months-if not indefinitely (large clot burden requiring TPA)  COVID-19 pneumonia: Treated with remdesivir/steroids during her most recent hospitalization from 2/17-2/19.  Does not require any further treatment at this point.  Tiny pulmonary nodules seen incidentally on CT chest: Stable for outpatient follow-up  Low-density lesion in  the dome of the liver seen incidentally on CT chest: Stable for outpatient follow-up with ultrasound.  Fever: afebrile  O2 requirements:  SpO2: 97 % O2 Flow Rate (L/min): 2 L/min   COVID-19 Labs: No results for input(s): DDIMER, FERRITIN, LDH, CRP in the last 72 hours.  No results found for: BNP  No results for input(s): PROCALCITON in the last 168 hours.  Lab Results  Component Value Date   SARSCOV2NAA Detected (A) 02/14/2020     Prone/Incentive Spirometry: encouraged  incentive spirometry use 3-4/hour.  DVT Prophylaxis  : IV heparin  OSA: CPAP nightly  Morbid Obesity: Estimated body mass index is 51.34 kg/m as calculated from the following:   Height as of this encounter: 5\' 7"  (1.702 m).   Weight as of this encounter: 148.7 kg.    Consults  :  PCCM  Procedures  :  None  ABG: No results found for: PHART, PCO2ART, PO2ART, HCO3, TCO2, ACIDBASEDEF, O2SAT  Vent Settings: N/A  Condition -Stable  Family Communication  : Patient will update family herself.  Code Status :  Full Code  Diet :  Diet Order            Diet heart healthy/carb modified Room service appropriate? Yes; Fluid consistency: Thin  Diet effective now               Disposition Plan  :  Remain hospitalized-Home tomorrow if stable overnight on oral anticoagulation.  Barriers to discharge: IV heparin with plans to transition to oral anticoagulant today-subsequently will be  monitored overnight before discharge on 3/4.  Antimicorbials  :    Anti-infectives (From admission, onward)   None      Inpatient Medications  Scheduled Meds: . Chlorhexidine Gluconate Cloth  6 each Topical Daily  . pantoprazole  40 mg Oral Daily   Continuous Infusions: . heparin 1,700 Units/hr (03/01/20 1239)   PRN Meds:.acetaminophen   Time Spent in minutes  25  See all Orders from today for further details   Jeoffrey Massed M.D on 03/01/2020 at 1:30 PM  To page go to [email or web address] - use universal password  [facility] -  Office  [phone]    Objective:   Vitals:   03/01/20 0428 03/01/20 0600 03/01/20 1000 03/01/20 1150  BP: (!) 101/43  120/78 128/77  Pulse: 76 69 91   Resp: 16  16   Temp: 98.1 F (36.7 C)  98 F (36.7 C) 98.6 F (37 C)  TempSrc: Oral Oral Oral Oral  SpO2: 99% 97% 97%   Weight:      Height:        Wt Readings from Last 3 Encounters:  02/28/20 (!) 148.7 kg  02/17/20 (!) 152.7 kg  12/07/18 (!) 149.7 kg     Intake/Output Summary (Last 24 hours) at 03/01/2020 1330 Last data filed at 03/01/2020 1239 Gross per 24 hour  Intake 1391.52 ml  Output 2002 ml  Net -610.48 ml     Physical Exam Gen Exam:Alert awake-not in any distress HEENT:atraumatic, normocephalic Chest: B/L clear to auscultation anteriorly CVS:S1S2 regular Abdomen:soft non tender, non distended Extremities:no edema Neurology: Non focal Skin: no rash   Data Review:    CBC Recent Labs  Lab 02/27/20 1026 02/28/20 0514 02/29/20 0647 03/01/20 0406  WBC 13.9* 9.3 9.0 9.0  HGB 15.0 13.5 13.2 12.3  HCT 47.7* 42.4 41.8 38.2  PLT 270 220 172 221  MCV 88.3 87.1 87.4 87.8  MCH 27.8 27.7 27.6 28.3  MCHC 31.4 31.8 31.6  32.2  RDW 14.3 14.4 14.3 14.1  LYMPHSABS 2.3 2.5 2.2 2.2  MONOABS 0.8 0.7 0.6 0.6  EOSABS 0.1 0.1 0.2 0.2  BASOSABS 0.0 0.0 0.0 0.0    Chemistries  Recent Labs  Lab 02/27/20 1026 02/28/20 0514 02/29/20 0647 03/01/20 0406  NA 137 139   --  139  K 4.2 3.8  --  4.1  CL 100 103  --  103  CO2 25 24  --  23  GLUCOSE 120* 143*  --  121*  BUN 20 16  --  12  CREATININE 0.88 0.95  --  0.84  CALCIUM 8.9 8.6*  --  8.7*  MG  --  2.3 2.2 2.3  AST 24 21  --   --   ALT 37 28  --   --   ALKPHOS 66 56  --   --   BILITOT 0.9 0.9  --   --    ------------------------------------------------------------------------------------------------------------------ No results for input(s): CHOL, HDL, LDLCALC, TRIG, CHOLHDL, LDLDIRECT in the last 72 hours.  No results found for: HGBA1C ------------------------------------------------------------------------------------------------------------------ No results for input(s): TSH, T4TOTAL, T3FREE, THYROIDAB in the last 72 hours.  Invalid input(s): FREET3 ------------------------------------------------------------------------------------------------------------------ No results for input(s): VITAMINB12, FOLATE, FERRITIN, TIBC, IRON, RETICCTPCT in the last 72 hours.  Coagulation profile Recent Labs  Lab 02/27/20 1025 02/28/20 0514  INR 1.0 1.4*    No results for input(s): DDIMER in the last 72 hours.  Cardiac Enzymes No results for input(s): CKMB, TROPONINI, MYOGLOBIN in the last 168 hours.  Invalid input(s): CK ------------------------------------------------------------------------------------------------------------------ No results found for: BNP  Micro Results Recent Results (from the past 240 hour(s))  Urine culture     Status: None   Collection Time: 02/27/20  2:13 PM   Specimen: Urine, Catheterized  Result Value Ref Range Status   Specimen Description   Final    URINE, CATHETERIZED Performed at [facility], [address] [place], [place] [address]    Special Requests   Final    Normal Performed at [facility], [address] [place], [place] [address]    Culture   Final    NO GROWTH Performed at [facility], [place] [address], [place], [place] [address]     Report Status 02/28/2020 FINAL  Final  MRSA PCR Screening     Status: None   Collection Time: 02/27/20  5:15 PM   Specimen: Nasopharyngeal  Result Value Ref Range Status   MRSA by PCR NEGATIVE NEGATIVE Final    Comment:        The GeneXpert MRSA Assay (FDA approved for NASAL specimens only), is one component of a comprehensive MRSA colonization surveillance program. It is not intended to diagnose MRSA infection nor to guide or monitor treatment for MRSA infections. Performed at [facility], [place] [address], [place], [place] [address]     Radiology Reports CT Angio Chest PE W and/or Wo Contrast  Result Date: 02/27/2020 CLINICAL DATA:  Shortness of breath, positive COVID test 2 weeks ago EXAM: CT ANGIOGRAPHY CHEST WITH CONTRAST TECHNIQUE: Multidetector CT imaging of the chest was performed using the standard protocol during bolus administration of intravenous contrast. Multiplanar CT image reconstructions and MIPs were obtained to evaluate the vascular anatomy. CONTRAST:  12mL OMNIPAQUE IOHEXOL 350 MG/ML SOLN COMPARISON:  Chest x-ray 02/26/2020 FINDINGS: Cardiovascular: Signs of bilateral lobar level emboli and embolus in the right main pulmonary artery. Emboli extend into upper and lower lobe branches on the right and left. Signs of  right heart strain with RV to LV ratio of approximately 2.1. Thoracic aorta is normal. No signs of pericardial effusion. Mediastinum/Nodes: No signs of axillary adenopathy. Thoracic inlet structures are normal. No signs of mediastinal or hilar adenopathy. Esophagus is normal. Lungs/Pleura: Basilar atelectasis. Airways are patent. Tiny nodule at the left lung base measuring 3 mm (image 92, series 7) no signs of pleural effusion. Tiny pulmonary nodule in the right upper lobe measuring approximately 3 mm (image 53, series 7) Upper Abdomen: Incidental imaging of upper abdominal contents shows lobulated hepatic contour with low-density lesion in the right hepatic  lobe measuring measuring water density at 2.4 x 1.8 cm. Lobular hepatic contours with suggestion of background hepatic steatosis. Musculoskeletal: Spinal degenerative changes without acute or destructive bone process. Review of the MIP images confirms the above findings. IMPRESSION: 1. Signs of bilateral lobar level pulmonary emboli and embolus in the right main pulmonary artery. Signs of right heart strain with RV to LV ratio of approximately 2.1. Large volume of pulmonary emboli in the lungs bilaterally, with dilatation of the right atrium and right ventricle (RV to LV ratioof 2.1) indicative of elevated right-sided heart pressures and right heart strain. These findings have been shown to be associated with a increased morbidity and mortality in the setting pulmonary embolism. Critical Value/emergent results were called by telephone at the time of interpretation on 02/27/2020 at 1:22 pm to provider [facility] , who verbally acknowledged these results. 2. Low-density lesion in the dome of the liver is likely a cyst, not well evaluated on the current study. Ultrasound follow-up is suggested in this patient with hepatic steatosis and lobular hepatic contours. 3. Tiny pulmonary nodules in the right upper lobe and left lung base. No follow-up needed if patient is low-risk (and has no known or suspected primary neoplasm). Non-contrast chest CT can be considered in 12 months if patient is high-risk. This recommendation follows the consensus statement: Guidelines for Management of Incidental Pulmonary Nodules Detected on CT Images:From the Fleischner Society 2017; published online before print ( ID Festus Barren. ID ). Electronically Signed   By: Donzetta Kohut M.D.   On: 02/27/2020 13:28   DG Chest Portable 1 View  Result Date: 02/16/2020 CLINICAL DATA:  Hypoxia, COVID-19 positive EXAM: PORTABLE CHEST 1 VIEW COMPARISON:  None. FINDINGS: The heart size and mediastinal contours are within normal limits. Subtle hazy  opacities within the peripheral aspects of the mid lung fields bilaterally. No pleural effusion or pneumothorax. The visualized skeletal structures are unremarkable. IMPRESSION: Subtle hazy opacities within the peripheral aspects of the mid lung fields bilaterally. Findings suspicious for early viral pneumonia. Electronically Signed   By: Duanne Guess D.O.   On: 02/16/2020 14:29   VAS [place] LOWER EXTREMITY VENOUS (DVT)  Result Date: 03/01/2020  Lower Venous DVTStudy Indications: Pulmonary embolism.  Anticoagulation: Heparin. Limitations: Body habitus and poor ultrasound/tissue interface. Comparison Study: No prior exam. Performing Technologist: Kennedy Bucker ARDMS, RVT  Examination Guidelines: A complete evaluation includes B-mode imaging, spectral Doppler, color Doppler, and power Doppler as needed of all accessible portions of each vessel. Bilateral testing is considered an integral part of a complete examination. Limited examinations for reoccurring indications may be performed as noted. The reflux portion of the exam is performed with the patient in reverse Trendelenburg.  +---------+---------------+---------+-----------+----------+------------------+ RIGHT    CompressibilityPhasicitySpontaneityPropertiesThrombus Aging     +---------+---------------+---------+-----------+----------+------------------+ CFV      Full           Yes      Yes                                     +---------+---------------+---------+-----------+----------+------------------+  SFJ      Full                                                            +---------+---------------+---------+-----------+----------+------------------+ FV Prox  Full                                         Visualized with                                                          color              +---------+---------------+---------+-----------+----------+------------------+ FV Mid   Full                                          Visualized with                                                          color              +---------+---------------+---------+-----------+----------+------------------+ FV DistalFull                                         Not visualized     +---------+---------------+---------+-----------+----------+------------------+ PFV      Full                                         Visualized with                                                          color              +---------+---------------+---------+-----------+----------+------------------+ POP      Full           Yes      Yes                                     +---------+---------------+---------+-----------+----------+------------------+ PTV      Full                                         Poorly visualized  +---------+---------------+---------+-----------+----------+------------------+ PERO     Full  Poorly visualized  +---------+---------------+---------+-----------+----------+------------------+   +---------+---------------+---------+-----------+----------+------------------+ LEFT     CompressibilityPhasicitySpontaneityPropertiesThrombus Aging     +---------+---------------+---------+-----------+----------+------------------+ CFV      Full           Yes      Yes                                     +---------+---------------+---------+-----------+----------+------------------+ SFJ      Full                                                            +---------+---------------+---------+-----------+----------+------------------+ FV Prox  Full                                         Visualized with                                                          color              +---------+---------------+---------+-----------+----------+------------------+ FV Mid   Full                                         Visualized with                                                           color              +---------+---------------+---------+-----------+----------+------------------+ FV DistalFull                                         Visualized with                                                          color              +---------+---------------+---------+-----------+----------+------------------+ PFV      Full                                         Visualized with  color              +---------+---------------+---------+-----------+----------+------------------+ POP      Full           Yes      Yes                                     +---------+---------------+---------+-----------+----------+------------------+ PTV      Full                                         Poorly visualized  +---------+---------------+---------+-----------+----------+------------------+ PERO     Full                                         Poorly visualized  +---------+---------------+---------+-----------+----------+------------------+    Summary: BILATERAL: - Limited visualization due to patient body habitus. - No evidence of deep vein thrombosis seen in the lower extremities, bilaterally.  RIGHT: - No cystic structure found in the popliteal fossa.  LEFT: - No cystic structure found in the popliteal fossa.  *See table(s) above for measurements and observations.    Preliminary

## 2020-03-01 NOTE — Progress Notes (Signed)
Patient states she is able to place herself on/off as tolerated. Added distilled H2O to chamber for humidity. Patient aware to call for assistance if needed.

## 2020-03-01 NOTE — Progress Notes (Signed)
Transitions of Care Pharmacist Note  Twania Bujak is a 54 y.o. female that has been diagnosed with PE and will be prescribed Eliquis (apixaban) at discharge. Benefits check in pending.  Patient Education: I provided the following education on Apixaban 3/3 to the patient: How to take the medication Described what the medication is Signs of bleeding Signs/symptoms of VTE and stroke  Answered their questions  Discharge Medications Plan: The patient wants to have their discharge medications filled by the Transitions of Care pharmacy rather than their usual pharmacy.  The discharge orders pharmacy has been changed to the Transitions of Care pharmacy, the patient will receive a phone call regarding co-pay, and their medications will be delivered by the Transitions of Care pharmacy.    Thank you,   Alvia Grove, PharmD PGY1 Acute Care Pharmacy Resident March 01, 2020

## 2020-03-01 NOTE — Progress Notes (Signed)
ANTICOAGULATION CONSULT NOTE - Follow Up Consult  Pharmacy Consult for Heparin Indication: pulmonary embolus  No Known Allergies  Patient Measurements: Height: 5\' 7"  (170.2 cm) Weight: (!) 327 lb 13.2 oz (148.7 kg) IBW/kg (Calculated) : 61.6 Heparin Dosing Weight:  98.3 kg  Vital Signs: Temp: 98.1 F (36.7 C) (03/03 0428) Temp Source: Oral (03/03 0600) BP: 101/43 (03/03 0428) Pulse Rate: 69 (03/03 0600)  Labs: Recent Labs    02/27/20 1025 02/27/20 1026 02/27/20 1026 02/27/20 2108 02/28/20 0514 02/28/20 1215 02/29/20 0647 02/29/20 1501 03/01/20 0406  HGB  --  15.0   < >  --  13.5  --  13.2  --  12.3  HCT  --  47.7*   < >  --  42.4  --  41.8  --  38.2  PLT  --  270   < >  --  220  --  172  --  221  APTT 25  --   --  43*  --   --   --   --   --   LABPROT 13.1  --   --   --  16.7*  --   --   --   --   INR 1.0  --   --   --  1.4*  --   --   --   --   HEPARINUNFRC  --   --   --   --  0.38   < > 0.19* 0.38 0.46  CREATININE  --  0.88  --   --  0.95  --   --   --   --   TROPONINIHS  --   --   --   --  54*  --   --   --   --    < > = values in this interval not displayed.    Estimated Creatinine Clearance: 104.2 mL/min (by C-G formula based on SCr of 0.95 mg/dL).   Assessment:  Anticoag: Heparin for PE w/ RHS, s/p tPA on 2/28 at 1417, d-dimer 0.76. Hep level 0.46 in goal. Hgb 12.3 dropping. Plts 221 ok.  - Pulmonary notes 3/1: Continue IV heparin and continue this for the next 2-5 days and then switch to oral anticoagulation  Goal of Therapy:  Heparin level 0.3-0.5 through today units/ml Monitor platelets by anticoagulation protocol: Yes   Plan:  Con't  IV heparin at 1700 units/hr.  Daily heparin level and CBC Hopefully transition to [facility] tomorrow.   Steffie Waggoner [facility], PharmD, BCPS Clinical Staff Pharmacist [email or web address] Merilynn Finland, Andres Bantz Stillinger 03/01/2020,9:05 AM

## 2020-03-01 NOTE — Plan of Care (Signed)

## 2020-03-01 NOTE — Progress Notes (Signed)
CPAP device set up and ready for use.

## 2020-03-01 NOTE — Progress Notes (Signed)
Bilateral lower extremity venous duplex exam completed.  Preliminary results can be found under CV proc under chart review.  03/01/2020 10:47 AM  Shevette Bess, K., RDMS, RVT

## 2020-03-02 MED ORDER — APIXABAN 5 MG PO TABS: 10.0000 mg | ORAL_TABLET | Freq: Two times a day (BID) | ORAL | 0 refills | Status: DC

## 2020-03-02 MED ORDER — APIXABAN 5 MG PO TABS: 5.0000 mg | ORAL_TABLET | Freq: Two times a day (BID) | ORAL | 0 refills | Status: DC

## 2020-03-02 MED FILL — ELIQUIS STARTER PACK 5 MG T: 5 | 30 days supply | Qty: 74 | Fill #0

## 2020-03-02 NOTE — Progress Notes (Signed)
Pt given discharge instructions, prescriptions, and care notes. Pt verbalized understanding AEB no further questions or concerns at this time. IV was discontinued, no redness, pain, or swelling noted at this time. Telemetry discontinued and Centralized Telemetry was notified. Pt left the floor via wheelchair with staff in stable condition. 

## 2020-03-02 NOTE — TOC Benefit Eligibility Note (Signed)
Transition of Care [facility]) Benefit Eligibility Note    Patient Details  Name: Susan Robertson MRN: [id number] Date of Birth: April 20, 1966   Medication/Dose: Eliquis 5mg   Covered?: Yes     Prescription Coverage [facility] Pharmacy[facility] [place]  Spoke with Person/Company/Phone Number:: [facility] [phone]  Co-Pay: $30 for 30 day retail  Prior Approval: No          [id number] [name] Phone Number: 03/02/2020, 9:00 AM

## 2020-03-02 NOTE — Discharge Summary (Signed)
PATIENT DETAILS Name: Susan Robertson Age: 54 y.o. Sex: female Date of Birth: [birth year] MRN: [id number]. Admitting Physician: Kalman Shan, MD NWG:NFAOZ, Channing Mutters, MD  Admit Date: 02/27/2020 Discharge date: 03/02/2020  Recommendations for Outpatient Follow-up:  1. Follow up with PCP in 1-2 weeks 2. Please obtain CMP/CBC in one week 3. New medication-Eliquis 4. Outpatient referral to hematology before discontinuation of anticoagulation. 5. Incidental finding of tiny pulmonary nodules and perhaps a cystic lesion in the liver dome-see below for further recommendations.  Admitted From:  Home  Disposition: Home   Home Health: No  Equipment/Devices: None  Discharge Condition: Stable  CODE STATUS: FULL CODE  Diet recommendation:  Diet Order            Diet general        Diet heart healthy/carb modified Room service appropriate? Yes; Fluid consistency: Thin  Diet effective now               Brief Narrative: Patient is a 54 y.o. female with PMHx of OSA-recent history of COVID-19 infection requiring hospitalization from 2/17-2/19-completed remdesivir infusion as an outpatient-presented to [facility] ED on 2/28 with shortness of breath-further work-up revealed a large pulmonary embolism with RV strain.  She was treated with TPA-and transferred to ICU at [facility].  Upon clinical stability-she was transferred to the [facility] hospitalist service on 3/2.  Significant Events: 2/17-2/19>> admitted to [facility] for COVID-19 pneumonia. 2/28>> presented to [facility] ED with shortness of breath-large PE with RV strain-received systemic TPA. 2/28>> transferred to ICU at [facility]. 2/28>> started on IV heparin 3/2>> transferred to [facility] hospitalist service. 3/3>> transitioned to Eliquis from IV heparin  Microbiology data: 2/28>> urine culture: Negative 2/17>> blood culture: Negative  Brief Hospital Course: Acute hypoxic respiratory failure secondary to submassive pulmonary embolism with RV strain:  Significantly better-on room air for the past several days.   On admission-she was given TPA-and then started on IV heparin since 2/28.  On 3/3-she was transitioned to Eliquis-and was monitored overnight.  Patient ambulated around the unit on 3/3 x 2 without desaturation.  She did not feel short of breath with ambulation yesterday.  Since she feels better-with hardly any shortness of breath with ambulation-and has been on room air-tolerating transitioning to oral anticoagulant well-I suspect she is stable for discharge.  She will be continued on Eliquis on discharge-she will require at least 6 to 9 months of uninterrupted anticoagulation-given the large clot burden-I have encouraged her to get a referral to hematology before discontinuing anticoagulation.  Note-lower extremity Dopplers were negative for DVT.  COVID-19 pneumonia: Treated with remdesivir/steroids during her most recent hospitalization from 2/17-2/19.  Does not require any further treatment at this point.  Tiny pulmonary nodules seen incidentally on CT chest: Stable for outpatient follow-up (patient aware of these findings/recommendations)  Low-density lesion in the dome of the liver seen incidentally on CT chest: Stable for outpatient follow-up with ultrasound.  (Patient aware of these findings/recommendations)  COVID-19 Labs:  No results for input(s): DDIMER, FERRITIN, LDH, CRP in the last 72 hours.  Lab Results  Component Value Date   SARSCOV2NAA Detected (A) 02/14/2020     OSA: CPAP nightly  Morbid Obesity: Estimated body mass index is 51.34 kg/m as calculated from the following:   Height as of this encounter: 5\' 7"  (1.702 m).   Weight as of this encounter: 148.7 kg.   Procedures None  Discharge Diagnoses:  Active Problems:   Pulmonary emboli [facility])   Discharge Instructions:    Person Under  Monitoring Name: Susan Robertson  Location: [address] [place] [address]   Infection Prevention  Recommendations for Individuals Confirmed to have, or Being Evaluated for, 2019 Novel Coronavirus (COVID-19) Infection Who Receive Care at Home  Individuals who are confirmed to have, or are being evaluated for, COVID-19 should follow the prevention steps below until a healthcare provider or local or state health department says they can return to normal activities.  Stay home except to get medical care You should restrict activities outside your home, except for getting medical care. Do not go to work, school, or public areas, and do not use public transportation or taxis.  Call ahead before visiting your doctor Before your medical appointment, call the healthcare provider and tell them that you have, or are being evaluated for, COVID-19 infection. This will help the healthcare provider's office take steps to keep other people from getting infected. Ask your healthcare provider to call the local or state health department.  Monitor your symptoms Seek prompt medical attention if your illness is worsening (e.g., difficulty breathing). Before going to your medical appointment, call the healthcare provider and tell them that you have, or are being evaluated for, COVID-19 infection. Ask your healthcare provider to call the local or state health department.  Wear a facemask You should wear a facemask that covers your nose and mouth when you are in the same room with other people and when you visit a healthcare provider. People who live with or visit you should also wear a facemask while they are in the same room with you.  Separate yourself from other people in your home As much as possible, you should stay in a different room from other people in your home. Also, you should use a separate bathroom, if available.  Avoid sharing household items You should not share dishes, drinking glasses, cups, eating utensils, towels, bedding, or other items with other people in your home. After using  these items, you should wash them thoroughly with soap and water.  Cover your coughs and sneezes Cover your mouth and nose with a tissue when you cough or sneeze, or you can cough or sneeze into your sleeve. Throw used tissues in a lined trash can, and immediately wash your hands with soap and water for at least 20 seconds or use an alcohol-based hand rub.  Wash your [employer] your hands often and thoroughly with soap and water for at least 20 seconds. You can use an alcohol-based hand sanitizer if soap and water are not available and if your hands are not visibly dirty. Avoid touching your eyes, nose, and mouth with unwashed hands.   Prevention Steps for Caregivers and Household Members of Individuals Confirmed to have, or Being Evaluated for, COVID-19 Infection Being Cared for in the Home  If you live with, or provide care at home for, a person confirmed to have, or being evaluated for, COVID-19 infection please follow these guidelines to prevent infection:  Follow healthcare provider's instructions Make sure that you understand and can help the patient follow any healthcare provider instructions for all care.  Provide for the patient's basic needs You should help the patient with basic needs in the home and provide support for getting groceries, prescriptions, and other personal needs.  Monitor the patient's symptoms If they are getting sicker, call his or her medical provider and tell them that the patient has, or is being evaluated for, COVID-19 infection. This will help the healthcare provider's office take steps to keep  other people from getting infected. Ask the healthcare provider to call the local or state health department.  Limit the number of people who have contact with the patient  If possible, have only one caregiver for the patient.  Other household members should stay in another home or place of residence. If this is not possible, they should stay  in another  room, or be separated from the patient as much as possible. Use a separate bathroom, if available.  Restrict visitors who do not have an essential need to be in the home.  Keep older adults, very young children, and other sick people away from the patient Keep older adults, very young children, and those who have compromised immune systems or chronic health conditions away from the patient. This includes people with chronic heart, lung, or kidney conditions, diabetes, and cancer.  Ensure good ventilation Make sure that shared spaces in the home have good air flow, such as from an air conditioner or an opened window, weather permitting.  Wash your hands often  Wash your hands often and thoroughly with soap and water for at least 20 seconds. You can use an alcohol based hand sanitizer if soap and water are not available and if your hands are not visibly dirty.  Avoid touching your eyes, nose, and mouth with unwashed hands.  Use disposable paper towels to dry your hands. If not available, use dedicated cloth towels and replace them when they become wet.  Wear a facemask and gloves  Wear a disposable facemask at all times in the room and gloves when you touch or have contact with the patient's blood, body fluids, and/or secretions or excretions, such as sweat, saliva, sputum, nasal mucus, vomit, urine, or feces.  Ensure the mask fits over your nose and mouth tightly, and do not touch it during use.  Throw out disposable facemasks and gloves after using them. Do not reuse.  Wash your hands immediately after removing your facemask and gloves.  If your personal clothing becomes contaminated, carefully remove clothing and launder. Wash your hands after handling contaminated clothing.  Place all used disposable facemasks, gloves, and other waste in a lined container before disposing them with other household waste.  Remove gloves and wash your hands immediately after handling these items.  Do  not share dishes, glasses, or other household items with the patient  Avoid sharing household items. You should not share dishes, drinking glasses, cups, eating utensils, towels, bedding, or other items with a patient who is confirmed to have, or being evaluated for, COVID-19 infection.  After the person uses these items, you should wash them thoroughly with soap and water.  Wash laundry thoroughly  Immediately remove and wash clothes or bedding that have blood, body fluids, and/or secretions or excretions, such as sweat, saliva, sputum, nasal mucus, vomit, urine, or feces, on them.  Wear gloves when handling laundry from the patient.  Read and follow directions on labels of laundry or clothing items and detergent. In general, wash and dry with the warmest temperatures recommended on the label.  Clean all areas the individual has used often  Clean all touchable surfaces, such as counters, tabletops, doorknobs, bathroom fixtures, toilets, phones, keyboards, tablets, and bedside tables, every day. Also, clean any surfaces that may have blood, body fluids, and/or secretions or excretions on them.  Wear gloves when cleaning surfaces the patient has come in contact with.  Use a diluted bleach solution (e.g., dilute bleach with 1 part bleach and 10 parts  water) or a household disinfectant with a label that says EPA-registered for coronaviruses. To make a bleach solution at home, add 1 tablespoon of bleach to 1 quart (4 cups) of water. For a larger supply, add  cup of bleach to 1 gallon (16 cups) of water.  Read labels of cleaning products and follow recommendations provided on product labels. Labels contain instructions for safe and effective use of the cleaning product including precautions you should take when applying the product, such as wearing gloves or eye protection and making sure you have good ventilation during use of the product.  Remove gloves and wash hands immediately after  cleaning.  Monitor yourself for signs and symptoms of illness Caregivers and household members are considered close contacts, should monitor their health, and will be asked to limit movement outside of the home to the extent possible. Follow the monitoring steps for close contacts listed on the symptom monitoring form.   ? If you have additional questions, contact your local health department or call the epidemiologist on call at [phone] (available 24/7). ? This guidance is subject to change. For the most up-to-date guidance from CDC, please refer to their website: TripMetro.hu    Activity:  As tolerated   Discharge Instructions    Call MD for:  difficulty breathing, headache or visual disturbances   Complete by: As directed    Call MD for:  extreme fatigue   Complete by: As directed    Call MD for:  persistant nausea and vomiting   Complete by: As directed    Diet general   Complete by: As directed    Discharge instructions   Complete by: As directed    1.)  3 weeks of isolation from 02/14/2020   Follow with Primary MD  Carylon Perches, MD in 1-2 weeks  Please get a complete blood count and chemistry panel checked by your Primary MD at your next visit, and again as instructed by your Primary MD.  Get Medicines reviewed and adjusted: Please take all your medications with you for your next visit with your Primary MD  Laboratory/radiological data: Please request your Primary MD to go over all hospital tests and procedure/radiological results at the follow up, please ask your Primary MD to get all Hospital records sent to his/her office.  In some cases, they will be blood work, cultures and biopsy results pending at the time of your discharge. Please request that your primary care M.D. follows up on these results.  Also Note the following: If you experience worsening of your admission symptoms, develop shortness of  breath, life threatening emergency, suicidal or homicidal thoughts you must seek medical attention immediately by calling 911 or calling your MD immediately  if symptoms less severe.  You must read complete instructions/literature along with all the possible adverse reactions/side effects for all the Medicines you take and that have been prescribed to you. Take any new Medicines after you have completely understood and accpet all the possible adverse reactions/side effects.   Do not drive when taking Pain medications or sleeping medications (Benzodaizepines)  Do not take more than prescribed Pain, Sleep and Anxiety Medications. It is not advisable to combine anxiety,sleep and pain medications without talking with your primary care practitioner  Special Instructions: If you have smoked or chewed Tobacco  in the last 2 yrs please stop smoking, stop any regular Alcohol  and or any Recreational drug use.  Wear Seat belts while driving.  Please note: You were cared for by  a hospitalist during your hospital stay. Once you are discharged, your primary care physician will handle any further medical issues. Please note that NO REFILLS for any discharge medications will be authorized once you are discharged, as it is imperative that you return to your primary care physician (or establish a relationship with a primary care physician if you do not have one) for your post hospital discharge needs so that they can reassess your need for medications and monitor your lab values.   Increase activity slowly   Complete by: As directed      Allergies as of 03/02/2020   No Known Allergies     Medication List    STOP taking these medications   dexamethasone 6 MG tablet Commonly known as: DECADRON     TAKE these medications   acetaminophen 500 MG tablet Commonly known as: TYLENOL Take 500 mg by mouth every 6 (six) hours as needed.   albuterol 108 (90 Base) MCG/ACT inhaler Commonly known as: VENTOLIN  HFA Inhale 2 puffs into the lungs every 6 (six) hours as needed for wheezing or shortness of breath.   apixaban 5 MG Tabs tablet Commonly known as: ELIQUIS Take 2 tablets (10 mg total) by mouth 2 (two) times daily for 11 doses.   apixaban 5 MG Tabs tablet Commonly known as: ELIQUIS Take 1 tablet (5 mg total) by mouth 2 (two) times daily. Start taking this dose (5 mg twice daily) on 03/08/2020 Start taking on: March 08, 2020   ascorbic acid 500 MG tablet Commonly known as: VITAMIN C Take 1 tablet (500 mg total) by mouth daily.   omeprazole 20 MG capsule Commonly known as: PriLOSEC Take 1 capsule (20 mg total) by mouth daily.   pseudoephedrine 120 MG 12 hr tablet Commonly known as: SUDAFED Take 120 mg by mouth every 12 (twelve) hours as needed for congestion.   zinc sulfate 220 (50 Zn) MG capsule Take 1 capsule (220 mg total) by mouth daily.      Follow-up Information    Carylon PerchesFagan, Roy, MD. Schedule an appointment as soon as possible for a visit in 1 week(s).   Specialty: Internal Medicine Contact information: [address] [place] [address] [phone]          No Known Allergies   Consultations:   pulmonary/intensive care   Other Procedures/Studies: CT Angio Chest PE W and/or Wo Contrast  Result Date: 02/27/2020 CLINICAL DATA:  Shortness of breath, positive COVID test 2 weeks ago EXAM: CT ANGIOGRAPHY CHEST WITH CONTRAST TECHNIQUE: Multidetector CT imaging of the chest was performed using the standard protocol during bolus administration of intravenous contrast. Multiplanar CT image reconstructions and MIPs were obtained to evaluate the vascular anatomy. CONTRAST:  100mL OMNIPAQUE IOHEXOL 350 MG/ML SOLN COMPARISON:  Chest x-ray 02/26/2020 FINDINGS: Cardiovascular: Signs of bilateral lobar level emboli and embolus in the right main pulmonary artery. Emboli extend into upper and lower lobe branches on the right and left. Signs of right heart strain with RV  to LV ratio of approximately 2.1. Thoracic aorta is normal. No signs of pericardial effusion. Mediastinum/Nodes: No signs of axillary adenopathy. Thoracic inlet structures are normal. No signs of mediastinal or hilar adenopathy. Esophagus is normal. Lungs/Pleura: Basilar atelectasis. Airways are patent. Tiny nodule at the left lung base measuring 3 mm (image 92, series 7) no signs of pleural effusion. Tiny pulmonary nodule in the right upper lobe measuring approximately 3 mm (image 53, series 7) Upper Abdomen: Incidental imaging of upper abdominal contents  shows lobulated hepatic contour with low-density lesion in the right hepatic lobe measuring measuring water density at 2.4 x 1.8 cm. Lobular hepatic contours with suggestion of background hepatic steatosis. Musculoskeletal: Spinal degenerative changes without acute or destructive bone process. Review of the MIP images confirms the above findings. IMPRESSION: 1. Signs of bilateral lobar level pulmonary emboli and embolus in the right main pulmonary artery. Signs of right heart strain with RV to LV ratio of approximately 2.1. Large volume of pulmonary emboli in the lungs bilaterally, with dilatation of the right atrium and right ventricle (RV to LV ratioof 2.1) indicative of elevated right-sided heart pressures and right heart strain. These findings have been shown to be associated with a increased morbidity and mortality in the setting pulmonary embolism. Critical Value/emergent results were called by telephone at the time of interpretation on 02/27/2020 at 1:22 pm to provider [facility] , who verbally acknowledged these results. 2. Low-density lesion in the dome of the liver is likely a cyst, not well evaluated on the current study. Ultrasound follow-up is suggested in this patient with hepatic steatosis and lobular hepatic contours. 3. Tiny pulmonary nodules in the right upper lobe and left lung base. No follow-up needed if patient is low-risk (and has no known  or suspected primary neoplasm). Non-contrast chest CT can be considered in 12 months if patient is high-risk. This recommendation follows the consensus statement: Guidelines for Management of Incidental Pulmonary Nodules Detected on CT Images:From the Fleischner Society 2017; published online before print ( ID Festus Barren. ID ). Electronically Signed   By: Donzetta Kohut M.D.   On: 02/27/2020 13:28   DG Chest Portable 1 View  Result Date: 02/16/2020 CLINICAL DATA:  Hypoxia, COVID-19 positive EXAM: PORTABLE CHEST 1 VIEW COMPARISON:  None. FINDINGS: The heart size and mediastinal contours are within normal limits. Subtle hazy opacities within the peripheral aspects of the mid lung fields bilaterally. No pleural effusion or pneumothorax. The visualized skeletal structures are unremarkable. IMPRESSION: Subtle hazy opacities within the peripheral aspects of the mid lung fields bilaterally. Findings suspicious for early viral pneumonia. Electronically Signed   By: Duanne Guess D.O.   On: 02/16/2020 14:29   VAS [place] LOWER EXTREMITY VENOUS (DVT)  Result Date: 03/01/2020  Lower Venous DVTStudy Indications: Pulmonary embolism.  Anticoagulation: Heparin. Limitations: Body habitus and poor ultrasound/tissue interface. Comparison Study: No prior exam. Performing Technologist: Kennedy Bucker ARDMS, RVT  Examination Guidelines: A complete evaluation includes B-mode imaging, spectral Doppler, color Doppler, and power Doppler as needed of all accessible portions of each vessel. Bilateral testing is considered an integral part of a complete examination. Limited examinations for reoccurring indications may be performed as noted. The reflux portion of the exam is performed with the patient in reverse Trendelenburg.  +---------+---------------+---------+-----------+----------+------------------+ RIGHT    CompressibilityPhasicitySpontaneityPropertiesThrombus Aging      +---------+---------------+---------+-----------+----------+------------------+ CFV      Full           Yes      Yes                                     +---------+---------------+---------+-----------+----------+------------------+ SFJ      Full                                                            +---------+---------------+---------+-----------+----------+------------------+  FV Prox  Full                                         Visualized with                                                          color              +---------+---------------+---------+-----------+----------+------------------+ FV Mid   Full                                         Visualized with                                                          color              +---------+---------------+---------+-----------+----------+------------------+ FV DistalFull                                         Not visualized     +---------+---------------+---------+-----------+----------+------------------+ PFV      Full                                         Visualized with                                                          color              +---------+---------------+---------+-----------+----------+------------------+ POP      Full           Yes      Yes                                     +---------+---------------+---------+-----------+----------+------------------+ PTV      Full                                         Poorly visualized  +---------+---------------+---------+-----------+----------+------------------+ PERO     Full                                         Poorly visualized  +---------+---------------+---------+-----------+----------+------------------+   +---------+---------------+---------+-----------+----------+------------------+ LEFT     CompressibilityPhasicitySpontaneityPropertiesThrombus Aging      +---------+---------------+---------+-----------+----------+------------------+ CFV      Full  Yes      Yes                                     +---------+---------------+---------+-----------+----------+------------------+ SFJ      Full                                                            +---------+---------------+---------+-----------+----------+------------------+ FV Prox  Full                                         Visualized with                                                          color              +---------+---------------+---------+-----------+----------+------------------+ FV Mid   Full                                         Visualized with                                                          color              +---------+---------------+---------+-----------+----------+------------------+ FV DistalFull                                         Visualized with                                                          color              +---------+---------------+---------+-----------+----------+------------------+ PFV      Full                                         Visualized with                                                          color              +---------+---------------+---------+-----------+----------+------------------+ POP      Full  Yes      Yes                                     +---------+---------------+---------+-----------+----------+------------------+ PTV      Full                                         Poorly visualized  +---------+---------------+---------+-----------+----------+------------------+ PERO     Full                                         Poorly visualized  +---------+---------------+---------+-----------+----------+------------------+     Summary: BILATERAL: - Limited visualization due to patient body habitus. - No evidence of deep vein  thrombosis seen in the lower extremities, bilaterally.  RIGHT: - No cystic structure found in the popliteal fossa.  LEFT: - No cystic structure found in the popliteal fossa.  *See table(s) above for measurements and observations. Electronically signed by Coral Else MD on 03/01/2020 at 8:57:01 PM.    Final      TODAY-DAY OF DISCHARGE:  Subjective:   Cain Saupe today has no headache,no chest abdominal pain,no new weakness tingling or numbness, feels much better wants to go home today.   Objective:   Blood pressure (!) 88/62, pulse 75, temperature 97.9 F (36.6 C), temperature source Oral, resp. rate 20, height 5\' 7"  (1.702 m), weight (!) 150.8 kg, SpO2 97 %.  Intake/Output Summary (Last 24 hours) at 03/02/2020 0920 Last data filed at 03/02/2020 0630 Gross per 24 hour  Intake 1831.52 ml  Output 900 ml  Net 931.52 ml   Filed Weights   02/27/20 1652 02/28/20 0500 03/02/20 0622  Weight: (!) 148 kg (!) 148.7 kg (!) 150.8 kg    Exam: Awake Alert, Oriented *3, No new F.N deficits, Normal affect Bryant.AT,PERRAL Supple Neck,No JVD, No cervical lymphadenopathy appriciated.  Symmetrical Chest wall movement, Good air movement bilaterally, CTAB RRR,No Gallops,Rubs or new Murmurs, No Parasternal Heave +ve B.Sounds, Abd Soft, Non tender, No organomegaly appriciated, No rebound -guarding or rigidity. No Cyanosis, Clubbing or edema, No new Rash or bruise   PERTINENT RADIOLOGIC STUDIES: CT Angio Chest PE W and/or Wo Contrast  Result Date: 02/27/2020 CLINICAL DATA:  Shortness of breath, positive COVID test 2 weeks ago EXAM: CT ANGIOGRAPHY CHEST WITH CONTRAST TECHNIQUE: Multidetector CT imaging of the chest was performed using the standard protocol during bolus administration of intravenous contrast. Multiplanar CT image reconstructions and MIPs were obtained to evaluate the vascular anatomy. CONTRAST:  OMNIPAQUE IOHEXOL 350 MG/ML SOLN COMPARISON:  Chest x-ray 02/26/2020 FINDINGS: Cardiovascular:  Signs of bilateral lobar level emboli and embolus in the right main pulmonary artery. Emboli extend into upper and lower lobe branches on the right and left. Signs of right heart strain with RV to LV ratio of approximately 2.1. Thoracic aorta is normal. No signs of pericardial effusion. Mediastinum/Nodes: No signs of axillary adenopathy. Thoracic inlet structures are normal. No signs of mediastinal or hilar adenopathy. Esophagus is normal. Lungs/Pleura: Basilar atelectasis. Airways are patent. Tiny nodule at the left lung base measuring 3 mm (image 92, series 7) no signs of pleural effusion. Tiny pulmonary nodule in the right upper lobe measuring approximately 3 mm (image 53, series 7) Upper Abdomen:  Incidental imaging of upper abdominal contents shows lobulated hepatic contour with low-density lesion in the right hepatic lobe measuring measuring water density at 2.4 x 1.8 cm. Lobular hepatic contours with suggestion of background hepatic steatosis. Musculoskeletal: Spinal degenerative changes without acute or destructive bone process. Review of the MIP images confirms the above findings. IMPRESSION: 1. Signs of bilateral lobar level pulmonary emboli and embolus in the right main pulmonary artery. Signs of right heart strain with RV to LV ratio of approximately 2.1. Large volume of pulmonary emboli in the lungs bilaterally, with dilatation of the right atrium and right ventricle (RV to LV ratioof 2.1) indicative of elevated right-sided heart pressures and right heart strain. These findings have been shown to be associated with a increased morbidity and mortality in the setting pulmonary embolism. Critical Value/emergent results were called by telephone at the time of interpretation on 02/27/2020 at 1:22 pm to provider [facility] , who verbally acknowledged these results. 2. Low-density lesion in the dome of the liver is likely a cyst, not well evaluated on the current study. Ultrasound follow-up is suggested in  this patient with hepatic steatosis and lobular hepatic contours. 3. Tiny pulmonary nodules in the right upper lobe and left lung base. No follow-up needed if patient is low-risk (and has no known or suspected primary neoplasm). Non-contrast chest CT can be considered in 12 months if patient is high-risk. This recommendation follows the consensus statement: Guidelines for Management of Incidental Pulmonary Nodules Detected on CT Images:From the Fleischner Society 2017; published online before print ( ID Festus Barren. ID ). Electronically Signed   By: Donzetta Kohut M.D.   On: 02/27/2020 13:28   DG Chest Portable 1 View  Result Date: 02/16/2020 CLINICAL DATA:  Hypoxia, COVID-19 positive EXAM: PORTABLE CHEST 1 VIEW COMPARISON:  None. FINDINGS: The heart size and mediastinal contours are within normal limits. Subtle hazy opacities within the peripheral aspects of the mid lung fields bilaterally. No pleural effusion or pneumothorax. The visualized skeletal structures are unremarkable. IMPRESSION: Subtle hazy opacities within the peripheral aspects of the mid lung fields bilaterally. Findings suspicious for early viral pneumonia. Electronically Signed   By: Duanne Guess D.O.   On: 02/16/2020 14:29   VAS [place] LOWER EXTREMITY VENOUS (DVT)  Result Date: 03/01/2020  Lower Venous DVTStudy Indications: Pulmonary embolism.  Anticoagulation: Heparin. Limitations: Body habitus and poor ultrasound/tissue interface. Comparison Study: No prior exam. Performing Technologist: Kennedy Bucker ARDMS, RVT  Examination Guidelines: A complete evaluation includes B-mode imaging, spectral Doppler, color Doppler, and power Doppler as needed of all accessible portions of each vessel. Bilateral testing is considered an integral part of a complete examination. Limited examinations for reoccurring indications may be performed as noted. The reflux portion of the exam is performed with the patient in reverse Trendelenburg.   +---------+---------------+---------+-----------+----------+------------------+ RIGHT    CompressibilityPhasicitySpontaneityPropertiesThrombus Aging     +---------+---------------+---------+-----------+----------+------------------+ CFV      Full           Yes      Yes                                     +---------+---------------+---------+-----------+----------+------------------+ SFJ      Full                                                            +---------+---------------+---------+-----------+----------+------------------+  FV Prox  Full                                         Visualized with                                                          color              +---------+---------------+---------+-----------+----------+------------------+ FV Mid   Full                                         Visualized with                                                          color              +---------+---------------+---------+-----------+----------+------------------+ FV DistalFull                                         Not visualized     +---------+---------------+---------+-----------+----------+------------------+ PFV      Full                                         Visualized with                                                          color              +---------+---------------+---------+-----------+----------+------------------+ POP      Full           Yes      Yes                                     +---------+---------------+---------+-----------+----------+------------------+ PTV      Full                                         Poorly visualized  +---------+---------------+---------+-----------+----------+------------------+ PERO     Full                                         Poorly visualized  +---------+---------------+---------+-----------+----------+------------------+    +---------+---------------+---------+-----------+----------+------------------+ LEFT     CompressibilityPhasicitySpontaneityPropertiesThrombus Aging     +---------+---------------+---------+-----------+----------+------------------+ CFV      Full  Yes      Yes                                     +---------+---------------+---------+-----------+----------+------------------+ SFJ      Full                                                            +---------+---------------+---------+-----------+----------+------------------+ FV Prox  Full                                         Visualized with                                                          color              +---------+---------------+---------+-----------+----------+------------------+ FV Mid   Full                                         Visualized with                                                          color              +---------+---------------+---------+-----------+----------+------------------+ FV DistalFull                                         Visualized with                                                          color              +---------+---------------+---------+-----------+----------+------------------+ PFV      Full                                         Visualized with                                                          color              +---------+---------------+---------+-----------+----------+------------------+ POP      Full  Yes      Yes                                     +---------+---------------+---------+-----------+----------+------------------+ PTV      Full                                         Poorly visualized  +---------+---------------+---------+-----------+----------+------------------+ PERO     Full                                         Poorly visualized   +---------+---------------+---------+-----------+----------+------------------+     Summary: BILATERAL: - Limited visualization due to patient body habitus. - No evidence of deep vein thrombosis seen in the lower extremities, bilaterally.  RIGHT: - No cystic structure found in the popliteal fossa.  LEFT: - No cystic structure found in the popliteal fossa.  *See table(s) above for measurements and observations. Electronically signed by Coral Else MD on 03/01/2020 at 8:57:01 PM.    Final      PERTINENT LAB RESULTS: CBC: Recent Labs    02/29/20 0647 03/01/20 0406  WBC 9.0 9.0  HGB 13.2 12.3  HCT 41.8 38.2  PLT 172 221   CMET CMP     Component Value Date/Time   NA 139 03/01/2020 0406   K 4.1 03/01/2020 0406   CL 103 03/01/2020 0406   CO2 23 03/01/2020 0406   GLUCOSE 121 (H) 03/01/2020 0406   BUN 12 03/01/2020 0406   CREATININE 0.84 03/01/2020 0406   CALCIUM 8.7 (L) 03/01/2020 0406   PROT 5.7 (L) 02/28/2020 0514   ALBUMIN 2.9 (L) 02/28/2020 0514   AST 21 02/28/2020 0514   ALT 28 02/28/2020 0514   ALKPHOS 56 02/28/2020 0514   BILITOT 0.9 02/28/2020 0514   GFRNONAA >60 03/01/2020 0406   GFRAA >60 03/01/2020 0406    GFR Estimated Creatinine Clearance: 119 mL/min (by C-G formula based on SCr of 0.84 mg/dL). No results for input(s): LIPASE, AMYLASE in the last 72 hours. No results for input(s): CKTOTAL, CKMB, CKMBINDEX, TROPONINI in the last 72 hours. Invalid input(s): POCBNP No results for input(s): DDIMER in the last 72 hours. No results for input(s): HGBA1C in the last 72 hours. No results for input(s): CHOL, HDL, LDLCALC, TRIG, CHOLHDL, LDLDIRECT in the last 72 hours. No results for input(s): TSH, T4TOTAL, T3FREE, THYROIDAB in the last 72 hours.  Invalid input(s): FREET3 No results for input(s): VITAMINB12, FOLATE, FERRITIN, TIBC, IRON, RETICCTPCT in the last 72 hours. Coags: No results for input(s): INR in the last 72 hours.  Invalid input(s): PT Microbiology: Recent  Results (from the past 240 hour(s))  Urine culture     Status: None   Collection Time: 02/27/20  2:13 PM   Specimen: Urine, Catheterized  Result Value Ref Range Status   Specimen Description   Final    URINE, CATHETERIZED Performed at [facility], [address], [place], [place] [address]    Special Requests   Final    Normal Performed at [facility], [address], [place], [place] [address]    Culture   Final    NO GROWTH Performed at [facility], [address] [place], [place] [address]  Report Status 02/28/2020 FINAL  Final  MRSA PCR Screening     Status: None   Collection Time: 02/27/20  5:15 PM   Specimen: Nasopharyngeal  Result Value Ref Range Status   MRSA by PCR NEGATIVE NEGATIVE Final    Comment:        The GeneXpert MRSA Assay (FDA approved for NASAL specimens only), is one component of a comprehensive MRSA colonization surveillance program. It is not intended to diagnose MRSA infection nor to guide or monitor treatment for MRSA infections. Performed at [facility], [address] [place], [place] [address]     FURTHER DISCHARGE INSTRUCTIONS:  Get Medicines reviewed and adjusted: Please take all your medications with you for your next visit with your Primary MD  Laboratory/radiological data: Please request your Primary MD to go over all hospital tests and procedure/radiological results at the follow up, please ask your Primary MD to get all Hospital records sent to his/her office.  In some cases, they will be blood work, cultures and biopsy results pending at the time of your discharge. Please request that your primary care M.D. goes through all the records of your hospital data and follows up on these results.  Also Note the following: If you experience worsening of your admission symptoms, develop shortness of breath, life threatening emergency, suicidal or homicidal thoughts you must seek medical attention immediately by calling 911  or calling your MD immediately  if symptoms less severe.  You must read complete instructions/literature along with all the possible adverse reactions/side effects for all the Medicines you take and that have been prescribed to you. Take any new Medicines after you have completely understood and accpet all the possible adverse reactions/side effects.   Do not drive when taking Pain medications or sleeping medications (Benzodaizepines)  Do not take more than prescribed Pain, Sleep and Anxiety Medications. It is not advisable to combine anxiety,sleep and pain medications without talking with your primary care practitioner  Special Instructions: If you have smoked or chewed Tobacco  in the last 2 yrs please stop smoking, stop any regular Alcohol  and or any Recreational drug use.  Wear Seat belts while driving.  Please note: You were cared for by a hospitalist during your hospital stay. Once you are discharged, your primary care physician will handle any further medical issues. Please note that NO REFILLS for any discharge medications will be authorized once you are discharged, as it is imperative that you return to your primary care physician (or establish a relationship with a primary care physician if you do not have one) for your post hospital discharge needs so that they can reassess your need for medications and monitor your lab values.  Total Time spent coordinating discharge including counseling, education and face to face time equals 35 minutes.  SignedJeoffrey Massed: Kristof Nadeem 03/02/2020 9:20 AM

## 2020-03-02 NOTE — Progress Notes (Signed)
Patient A&O x4. Patient given status update. Questions answered. Patient verbalized understanding. Patient pleasant and cooperative with assessment and meds. Patient accepting of this RN assessing patient late d/t emergency situation. Patient does not want HS snack. Patient repositioning self at this time. Patient resting in bed at this time, bed alarm on and call light within reach. Will continue to monitor.

## 2020-03-02 NOTE — Care Management (Addendum)
Pt deemed stable for discharge home today.  Pt confirms she is independent from home and denied barriers with purchasing medications.  Pt has PCP.  Pt will go home on Eliquis.  TOC will deliver free 30 day supply of Eliquis prior to discharge.  CM informed pt of ongoing copay for drug.  Pt will call PCP once discharged to set up follow up appt and inform them she is discharging home on Eliquis.  NO other CM needs determined - CM signing off

## 2020-03-17 ENCOUNTER — Other Ambulatory Visit (HOSPITAL_COMMUNITY): Payer: Self-pay | Admitting: Internal Medicine

## 2020-03-17 DIAGNOSIS — K7689 Other specified diseases of liver: Secondary | ICD-10-CM

## 2020-05-23 ENCOUNTER — Ambulatory Visit (HOSPITAL_COMMUNITY)
Admission: RE | Admit: 2020-05-23 | Discharge: 2020-05-23 | Disposition: A | Discharge status: Home / Self Care | Payer: BC Managed Care – PPO | Source: Ambulatory Visit | Attending: Internal Medicine | Admitting: Internal Medicine

## 2020-05-23 ENCOUNTER — Other Ambulatory Visit: Payer: Self-pay

## 2020-05-23 DIAGNOSIS — K7689 Other specified diseases of liver: Secondary | ICD-10-CM | POA: Diagnosis present

## 2020-11-20 ENCOUNTER — Inpatient Hospital Stay (HOSPITAL_COMMUNITY): Payer: BC Managed Care – PPO

## 2020-11-20 ENCOUNTER — Inpatient Hospital Stay (HOSPITAL_COMMUNITY): Payer: BC Managed Care – PPO | Attending: Hematology | Admitting: Hematology

## 2020-11-20 ENCOUNTER — Other Ambulatory Visit: Payer: Self-pay

## 2020-11-20 ENCOUNTER — Encounter (HOSPITAL_COMMUNITY): Payer: Self-pay | Admitting: Hematology

## 2020-11-20 VITALS — BP 135/85 | HR 77 | Temp 97.0°F | Resp 20 | Ht 66.0 in | Wt 282.3 lb

## 2020-11-20 DIAGNOSIS — I2609 Other pulmonary embolism with acute cor pulmonale: Secondary | ICD-10-CM | POA: Diagnosis not present

## 2020-11-20 DIAGNOSIS — G4730 Sleep apnea, unspecified: Secondary | ICD-10-CM | POA: Diagnosis not present

## 2020-11-20 DIAGNOSIS — E669 Obesity, unspecified: Secondary | ICD-10-CM | POA: Diagnosis not present

## 2020-11-20 DIAGNOSIS — Z7901 Long term (current) use of anticoagulants: Secondary | ICD-10-CM | POA: Insufficient documentation

## 2020-11-20 DIAGNOSIS — M1990 Unspecified osteoarthritis, unspecified site: Secondary | ICD-10-CM | POA: Insufficient documentation

## 2020-11-20 DIAGNOSIS — Z807 Family history of other malignant neoplasms of lymphoid, hematopoietic and related tissues: Secondary | ICD-10-CM | POA: Insufficient documentation

## 2020-11-20 DIAGNOSIS — Z8616 Personal history of COVID-19: Secondary | ICD-10-CM | POA: Insufficient documentation

## 2020-11-20 DIAGNOSIS — Z86711 Personal history of pulmonary embolism: Secondary | ICD-10-CM | POA: Insufficient documentation

## 2020-11-20 LAB — D-DIMER, QUANTITATIVE: D-Dimer, Quant: 0.29 ug/mL-FEU (ref 0.00–0.50)

## 2020-11-20 NOTE — Progress Notes (Signed)
[facility] [address] [place] [address]   CLINIC:  Medical Oncology/Hematology  Patient Care Team: Carylon Perches, MD as PCP - General (Internal Medicine) Jena Gauss [name], MD as Consulting Physician (Gastroenterology)  CHIEF COMPLAINTS/PURPOSE OF CONSULTATION:  Evaluation of PE  HISTORY OF PRESENTING ILLNESS:  Susan Robertson 54 y.o. female is here because of evaluation of PE, at the request of Dr. Carylon Perches.  Today she reports that she had a COVID-induced PE on February 14, 2020, and was placed on Eliquis. She denies any previous PE, DVT's or miscarriages; she denies presence of lupus anticoagulant or RA. She denies having F/C, night sweats, or unexplained weight loss. She is tolerating the Eliquis well and denies having nosebleeds, hematuria, hematochezia or leg swelling. She denies having any history of cancer. She uses a CPAP machine at night.  She received her second Pfizer vaccine on 06/30/2020. Her maternal aunt had a blood clot after knee surgery. Maternal uncle had lymphoma; maternal GM had breast cancer; both GF had metastatic cancers by the time it was discovered. 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 She is a non-smoker.   MEDICAL HISTORY:  Past Medical History:  Diagnosis Date  . Arthritis   . Joint pain   . Sleep apnea    in process of getting CPAP.    SURGICAL HISTORY: Past Surgical History:  Procedure Laterality Date  . COLONOSCOPY WITH PROPOFOL N/A 11/05/2018   Procedure: COLONOSCOPY WITH PROPOFOL;  Surgeon: Corbin Ade, MD;  Location: [facility];  Service: Endoscopy;  Laterality: N/A;  1:30pm - pt knows to arrive at 8:00  . ingrown toenail Bilateral    Great toes  . KNEE ARTHROSCOPY Left 2014  . WISDOM TOOTH EXTRACTION      SOCIAL HISTORY: Social History   Socioeconomic History  . Marital status: Single    Spouse name: Not on file  . Number of children: Not on file  . Years of education: Not on file  .  Highest education level: Not on file  Occupational History    Comment: nurse educator  Tobacco Use  . Smoking status: Never Smoker  . Smokeless tobacco: Never Used  Vaping Use  . Vaping Use: Never used  Substance and Sexual Activity  . Alcohol use: Yes    Comment: occas  . Drug use: Never  . Sexual activity: Not Currently    Birth control/protection: None  Other Topics Concern  . Not on file  Social History Narrative  . Not on file   Social Determinants of Health   Financial Resource Strain: Low Risk   . Difficulty of Paying Living Expenses: Not hard at all  Food Insecurity: No Food Insecurity  . Worried About Programme researcher, broadcasting/film/video in the Last Year: Never true  . Ran Out of Food in the Last Year: Never true  Transportation Needs: No Transportation Needs  . Lack of Transportation (Medical): No  . Lack of Transportation (Non-Medical): No  Physical Activity: Inactive  . Days of Exercise per Week: 0 days  . Minutes of Exercise per Session: 0 min  Stress: No Stress Concern Present  . Feeling of Stress : Not at all  Social Connections: Moderately Integrated  . Frequency of Communication with Friends and Family: More than three times a week  . Frequency of Social Gatherings with Friends and Family: Three times a week  . Attends Religious Services: More than 4 times per year  . Active  Member of Clubs or Organizations: Yes  . Attends Banker Meetings: More than 4 times per year  . Marital Status: Never married  Intimate Partner Violence: Not At Risk  . Fear of Current or Ex-Partner: No  . Emotionally Abused: No  . Physically Abused: No  . Sexually Abused: No    FAMILY HISTORY: Family History  Problem Relation Age of Onset  . Colon polyps Father 23  . Colon cancer Neg Hx     ALLERGIES:  has No Known Allergies.  MEDICATIONS:  Current Outpatient Medications  Medication Sig Dispense Refill  . acetaminophen (TYLENOL) 500 MG tablet Take 500 mg by mouth every 6  (six) hours as needed.    Marland Kitchen apixaban (ELIQUIS) 5 MG TABS tablet Take 1 tablet (5 mg total) by mouth 2 (two) times daily. Start taking this dose after completion of Eliquis Starter Pack 120 tablet 0   No current facility-administered medications for this visit.    REVIEW OF SYSTEMS:   Review of Systems  Constitutional: Negative for appetite change, chills, diaphoresis, fatigue, fever and unexpected weight change.  HENT:   Negative for nosebleeds.   Gastrointestinal: Negative for blood in stool.  Genitourinary: Negative for hematuria.   Musculoskeletal: Positive for back pain.     PHYSICAL EXAMINATION: ECOG PERFORMANCE STATUS: 0 - Asymptomatic  Vitals:   11/20/20 1318  BP: 135/85  Pulse: 77  Resp: 20  Temp: (!) 97 F (36.1 C)  SpO2: 96%   Filed Weights   11/20/20 1318  Weight: 282 lb 4.8 oz (128.1 kg)   Physical Exam Vitals reviewed.  Constitutional:      Appearance: Normal appearance. She is obese.  Cardiovascular:     Rate and Rhythm: Normal rate and regular rhythm.     Pulses: Normal pulses.     Heart sounds: Normal heart sounds.  Pulmonary:     Effort: Pulmonary effort is normal.     Breath sounds: Normal breath sounds.  Abdominal:     Palpations: Abdomen is soft. There is no hepatomegaly, splenomegaly or mass.     Tenderness: There is no abdominal tenderness.     Hernia: No hernia is present.  Musculoskeletal:     Right lower leg: No edema.     Left lower leg: No edema.  Lymphadenopathy:     Cervical: No cervical adenopathy.     Upper Body:     Right upper body: No supraclavicular, axillary or pectoral adenopathy.     Left upper body: No supraclavicular, axillary or pectoral adenopathy.     Lower Body: No right inguinal adenopathy. No left inguinal adenopathy.  Neurological:     General: No focal deficit present.     Mental Status: She is alert and oriented to person, place, and time.  Psychiatric:        Mood and Affect: Mood normal.        Behavior:  Behavior normal.      LABORATORY DATA:  I have reviewed the data as listed Recent Results (from the past 2160 hour(s))  D-dimer, quantitative     Status: None   Collection Time: 11/20/20  2:34 PM  Result Value Ref Range   D-Dimer, Quant 0.29 0.00 - 0.50 ug/mL-FEU    Comment: (NOTE) At the manufacturer cut-off value of 0.5 g/mL FEU, this assay has a negative predictive value of 95-100%.This assay is intended for use in conjunction with a clinical pretest probability (PTP) assessment model to exclude pulmonary embolism (PE) and deep  venous thrombosis (DVT) in outpatients suspected of PE or DVT. Results should be correlated with clinical presentation. Performed at [facility], [address] [place], [place] [address]     RADIOGRAPHIC STUDIES: I have personally reviewed the radiological images as listed and agreed with the findings in the report. No results found.  ASSESSMENT:  1. Bilateral PE in the setting of Covid infection: -Admitted to [facility] for COVID-19 pneumonia from 02/16/2020 through 02/18/2020. -Presented to [facility] ED with shortness of breath. -CT scan on 02/27/2020 shows bilateral lobar level PE and embolus in the right main pulmonary artery with signs of right heart strain.  Large volume of pulmonary emboli in the lungs bilaterally with dilation of the right atrium and right ventricle. -Doppler of the lower extremities on 03/01/2020 was negative for DVT. -She has been on Eliquis since then without any complications. -No previous history of DVTs or miscarriages.  Family history significant for maternal aunt with DVT after knee surgery.  2.  Social/family history: -She is a Risk analyst at [employer] in [place].  Never smoker. -Both maternal and paternal grandfathers had metastatic cancer, type unknown.  Maternal uncle had lymphoma.  Maternal grandmother had breast cancer.    PLAN:  1.  Bilateral PE in the setting of Covid infection: -She had provoked  pulmonary embolism secondary to Covid infection. -She has been on anticoagulation for more than 6 months with Eliquis, without any complications. -Her breathing has returned to normal levels. -I have recommended doing a CT angio to evaluate for complete resolution of the blood clots.  We will also check D-dimer level. -If the CT scan and D-dimer are normal, she can come off of anticoagulation. -We will schedule a phone visit to discuss results and further plan.   Doreatha Massed, MD 11/20/20 3:13 PM  [facility] [phone]   I, Drue Second, am acting as a scribe for Dr. Payton Mccallum.  I, Doreatha Massed MD, have reviewed the above documentation for accuracy and completeness, and I agree with the above.

## 2020-11-20 NOTE — Patient Instructions (Signed)
[facility] at [facility] Discharge Instructions  You were seen today by Dr. Ellin Saba.  He is a Acupuncturist.  He discussed your recent medical history, past history and the events that led to today's appointment.  He talked with you about getting a CT scan of your chest to assess for pulmonary embolism.  He also would like to do lab work to assess your d-dimer.  He will call you in 1 weeks to review results and determine if you can discontinue treatment.    Thank you for choosing [facility] at [facility] to provide your oncology and hematology care.  To afford each patient quality time with our provider, please arrive at least 15 minutes before your scheduled appointment time.   If you have a lab appointment with the Cancer Center please come in thru the Main Entrance and check in at the main information desk.  You need to re-schedule your appointment should you arrive 10 or more minutes late.  We strive to give you quality time with our providers, and arriving late affects you and other patients whose appointments are after yours.  Also, if you no show three or more times for appointments you may be dismissed from the clinic at the providers discretion.     Again, thank you for choosing [facility].  Our hope is that these requests will decrease the amount of time that you wait before being seen by our physicians.       _____________________________________________________________  Should you have questions after your visit to [facility], please contact our office at [phone] and follow the prompts.  Our office hours are 8:00 a.m. and 4:30 p.m. Monday - Friday.  Please note that voicemails left after 4:00 p.m. may not be returned until the following business day.  We are closed weekends and major holidays.  You do have access to a nurse 24-7, just call the main number to the clinic [phone] and do not press any  options, hold on the line and a nurse will answer the phone.    For prescription refill requests, have your pharmacy contact our office and allow 72 hours.    Due to Covid, you will need to wear a mask upon entering the hospital. If you do not have a mask, a mask will be given to you at the Main Entrance upon arrival. For doctor visits, patients may have 1 support person age 49 or older with them. For treatment visits, patients can not have anyone with them due to social distancing guidelines and our immunocompromised population.

## 2020-12-20 ENCOUNTER — Ambulatory Visit (HOSPITAL_COMMUNITY)
Admission: RE | Admit: 2020-12-20 | Discharge: 2020-12-20 | Disposition: A | Discharge status: Home / Self Care | Payer: BC Managed Care – PPO | Source: Ambulatory Visit | Attending: Hematology | Admitting: Hematology

## 2020-12-20 ENCOUNTER — Other Ambulatory Visit: Payer: Self-pay

## 2020-12-20 DIAGNOSIS — I2609 Other pulmonary embolism with acute cor pulmonale: Secondary | ICD-10-CM | POA: Insufficient documentation

## 2020-12-20 MED ORDER — IOHEXOL 350 MG/ML SOLN
100.0000 mL | Freq: Once | INTRAVENOUS | Status: AC | PRN
  Administered 2020-12-20: 100 mL via INTRAVENOUS

## 2020-12-28 ENCOUNTER — Inpatient Hospital Stay (HOSPITAL_COMMUNITY): Payer: BC Managed Care – PPO | Attending: Hematology | Admitting: Hematology

## 2020-12-28 DIAGNOSIS — Z8616 Personal history of COVID-19: Secondary | ICD-10-CM | POA: Diagnosis not present

## 2020-12-28 DIAGNOSIS — Z7901 Long term (current) use of anticoagulants: Secondary | ICD-10-CM

## 2020-12-28 DIAGNOSIS — Z86711 Personal history of pulmonary embolism: Secondary | ICD-10-CM

## 2020-12-28 DIAGNOSIS — R931 Abnormal findings on diagnostic imaging of heart and coronary circulation: Secondary | ICD-10-CM | POA: Diagnosis not present

## 2020-12-28 NOTE — Progress Notes (Signed)
Virtual Visit via Telephone Note  I connected with Susan Robertson on 12/28/20 at  4:15 PM EST by telephone and verified that I am speaking with the correct person using two identifiers.  Location: Patient: At home Provider: In the office   I discussed the limitations, risks, security and privacy concerns of performing an evaluation and management service by telephone and the availability of in person appointments. I also discussed with the patient that there may be a patient responsible charge related to this service. The patient expressed understanding and agreed to proceed.   History of Present Illness: She had a history of COVID-19 pneumonia on 02/16/2020.  A CT scan on 02/19/2020 showed bilateral lobar level pulmonary embolism with embolus in the right main artery with signs of right heart strain.  Large volume of pulmonary emboli in the lungs bilaterally with dilation of the right atrium and right ventricle.  Dopplers were negative at that time.  She has been on Eliquis since then.  No previous history of DVTs or miscarriages.  Family history significant for maternal aunt with DVT after knee surgery.   Observations/Objective: She is continuing Eliquis without any bleeding issues.  Assessment and Plan:  1.  Bilateral pulmonary embolism in the setting of Covid infection: -Reviewed results of D-dimer from 11/20/2020 which was 0.29. -Reviewed results of CT angio from 12/20/2020 with no acute cardiopulmonary disease.  No evidence of pulmonary embolism.  There is evidence of cardiomegaly.  Evidence of fatty infiltration of the liver with two hepatic cysts which are unchanged. -As her pulmonary embolism was provoked from Covid infection, and she has been adequately treated, I have recommended that she may come off of Eliquis at this time.  2.  Cardiomegaly: -Based on report from CT scan, I have recommended echocardiogram.   Follow Up Instructions: Schedule echocardiogram and follow-up phone  visit   I discussed the assessment and treatment plan with the patient. The patient was provided an opportunity to ask questions and all were answered. The patient agreed with the plan and demonstrated an understanding of the instructions.   The patient was advised to call back or seek an in-person evaluation if the symptoms worsen or if the condition fails to improve as anticipated.  I provided 11 minutes of non-face-to-face time during this encounter.   Doreatha Massed, MD

## 2021-01-01 ENCOUNTER — Other Ambulatory Visit (HOSPITAL_COMMUNITY): Payer: Self-pay

## 2021-01-01 DIAGNOSIS — I2609 Other pulmonary embolism with acute cor pulmonale: Secondary | ICD-10-CM

## 2021-01-15 ENCOUNTER — Other Ambulatory Visit (HOSPITAL_COMMUNITY): Payer: BC Managed Care – PPO

## 2021-01-18 ENCOUNTER — Ambulatory Visit (HOSPITAL_COMMUNITY)
Admission: RE | Admit: 2021-01-18 | Discharge: 2021-01-18 | Disposition: A | Discharge status: Home / Self Care | Payer: BC Managed Care – PPO | Source: Ambulatory Visit | Attending: Hematology | Admitting: Hematology

## 2021-01-18 ENCOUNTER — Other Ambulatory Visit: Payer: Self-pay

## 2021-01-18 DIAGNOSIS — I2609 Other pulmonary embolism with acute cor pulmonale: Secondary | ICD-10-CM | POA: Diagnosis present

## 2021-01-18 LAB — ECHOCARDIOGRAM COMPLETE
Area-P 1/2: 3.2 cm2
S' Lateral: 2.6 cm

## 2021-01-18 MED ORDER — PERFLUTREN LIPID MICROSPHERE
1.0000 mL | INTRAVENOUS | Status: AC | PRN
  Administered 2021-01-18: 2 mL via INTRAVENOUS
  Filled 2021-01-18: qty 10

## 2021-01-18 NOTE — Progress Notes (Signed)
*  PRELIMINARY RESULTS* Echocardiogram 2D Echocardiogram has been performed with Definity.  Stacey Drain 01/18/2021, 1:20 PM

## 2021-01-23 ENCOUNTER — Encounter (HOSPITAL_COMMUNITY): Payer: Self-pay | Admitting: Hematology

## 2021-01-23 ENCOUNTER — Inpatient Hospital Stay (HOSPITAL_COMMUNITY): Payer: BC Managed Care – PPO | Attending: Hematology | Admitting: Hematology

## 2021-01-23 DIAGNOSIS — I2609 Other pulmonary embolism with acute cor pulmonale: Secondary | ICD-10-CM | POA: Diagnosis not present

## 2021-01-23 NOTE — Progress Notes (Signed)
Virtual Visit via Telephone Note  I connected with Susan Robertson on 01/23/21 at  4:15 PM EST by telephone and verified that I am speaking with the correct person using two identifiers.  Location: Patient: At home Provider: In the office   I discussed the limitations, risks, security and privacy concerns of performing an evaluation and management service by telephone and the availability of in person appointments. I also discussed with the patient that there may be a patient responsible charge related to this service. The patient expressed understanding and agreed to proceed.   History of Present Illness: She had a history of COVID-19 pneumonia on 02/16/2020 followed by bilateral lobar level pulmonary embolism with embolus in the right main artery with signs of right heart strain on 02/19/2020.  Large volume of pulmonary emboli in the lungs bilaterally with dilation of the right atrium and right ventricle.  Dopplers were negative.  She was treated with Eliquis.   Observations/Objective: Her Eliquis was discontinued after last visit on 12/28/2020.  She denies any prior history of hypertension or other cardiac conditions.  She had echocardiogram done.  No signs of PND or orthopnea noted on review of systems.  Assessment and Plan:  1.  Bilateral pulmonary embolism in the setting of COVID-19 infection: -D-dimer on 11/20/2020 was negative. -CT angio on 12/20/2020 with no evidence of pulmonary embolism with evidence of cardiomegaly. -RTC 6 months with a repeat D-dimer.  If it continues to be negative, she may be discharged to her primary care.  2.  Cardiomegaly: -CT scan on 12/20/2020 suggested cardiomegaly. -I have reviewed results of 2D echo from 01/18/2021 which showed normal LV function with no regional wall motion abnormalities.  Left ventricular diastolic parameters are indeterminate.  There is borderline left ventricular hypertrophy.  Right ventricle size is normal.  Left atrial was mildly  dilated.  Right atrial size is normal.  Valves are normal except the aortic valve which was not very well visualized. -These findings are insignificant in somebody who has no history of cardiac problems. -I have told her to follow-up with Dr. Ouida Sills should she develop any chest pains or symptoms of congestive heart failure.   Follow Up Instructions: RTC 6 months with labs.   I discussed the assessment and treatment plan with the patient. The patient was provided an opportunity to ask questions and all were answered. The patient agreed with the plan and demonstrated an understanding of the instructions.   The patient was advised to call back or seek an in-person evaluation if the symptoms worsen or if the condition fails to improve as anticipated.  I provided 11  Minutes of non-face-to-face time during this encounter.   Doreatha Massed, MD

## 2021-07-31 ENCOUNTER — Inpatient Hospital Stay (HOSPITAL_COMMUNITY): Payer: BC Managed Care – PPO

## 2021-08-07 ENCOUNTER — Ambulatory Visit (HOSPITAL_COMMUNITY): Payer: BC Managed Care – PPO | Admitting: Hematology

## 2021-08-08 ENCOUNTER — Telehealth (HOSPITAL_COMMUNITY): Payer: BC Managed Care – PPO | Admitting: Oncology

## 2022-02-18 ENCOUNTER — Other Ambulatory Visit: Payer: Self-pay | Admitting: Obstetrics and Gynecology

## 2022-02-18 DIAGNOSIS — R928 Other abnormal and inconclusive findings on diagnostic imaging of breast: Secondary | ICD-10-CM

## 2022-02-28 ENCOUNTER — Other Ambulatory Visit: Payer: Self-pay

## 2022-02-28 ENCOUNTER — Ambulatory Visit
Admission: RE | Admit: 2022-02-28 | Discharge: 2022-02-28 | Disposition: A | Discharge status: Home / Self Care | Payer: BC Managed Care – PPO | Source: Ambulatory Visit | Attending: Obstetrics and Gynecology | Admitting: Obstetrics and Gynecology

## 2022-02-28 ENCOUNTER — Other Ambulatory Visit: Payer: Self-pay | Admitting: Obstetrics and Gynecology

## 2022-02-28 DIAGNOSIS — R928 Other abnormal and inconclusive findings on diagnostic imaging of breast: Secondary | ICD-10-CM

## 2022-03-13 ENCOUNTER — Other Ambulatory Visit (HOSPITAL_COMMUNITY): Payer: Self-pay | Admitting: Diagnostic Radiology

## 2022-03-13 ENCOUNTER — Ambulatory Visit
Admission: RE | Admit: 2022-03-13 | Discharge: 2022-03-13 | Disposition: A | Discharge status: Home / Self Care | Payer: BC Managed Care – PPO | Source: Ambulatory Visit | Attending: Obstetrics and Gynecology | Admitting: Obstetrics and Gynecology

## 2022-03-13 DIAGNOSIS — R928 Other abnormal and inconclusive findings on diagnostic imaging of breast: Secondary | ICD-10-CM

## 2023-05-26 ENCOUNTER — Ambulatory Visit (INDEPENDENT_AMBULATORY_CARE_PROVIDER_SITE_OTHER): Payer: BC Managed Care – PPO

## 2023-05-26 ENCOUNTER — Ambulatory Visit
Admission: RE | Admit: 2023-05-26 | Discharge: 2023-05-26 | Disposition: A | Discharge status: Home / Self Care | Payer: BC Managed Care – PPO | Source: Ambulatory Visit | Attending: Emergency Medicine | Admitting: Emergency Medicine

## 2023-05-26 VITALS — BP 155/102 | HR 99 | Temp 99.3°F | Resp 18

## 2023-05-26 DIAGNOSIS — J189 Pneumonia, unspecified organism: Secondary | ICD-10-CM | POA: Diagnosis not present

## 2023-05-26 DIAGNOSIS — I10 Essential (primary) hypertension: Secondary | ICD-10-CM

## 2023-05-26 LAB — SARS CORONAVIRUS 2 BY RT PCR: SARS Coronavirus 2 by RT PCR: NEGATIVE

## 2023-05-26 MED ORDER — AEROCHAMBER MV MISC: 2 refills | Status: AC

## 2023-05-26 MED ORDER — LEVOFLOXACIN 500 MG PO TABS: 500.0000 mg | ORAL_TABLET | Freq: Every day | ORAL | 0 refills | Status: AC

## 2023-05-26 MED ORDER — PROMETHAZINE-DM 6.25-15 MG/5ML PO SYRP: 5.0000 mL | ORAL_SOLUTION | Freq: Four times a day (QID) | ORAL | 0 refills | Status: AC | PRN

## 2023-05-26 MED ORDER — ALBUTEROL SULFATE HFA 108 (90 BASE) MCG/ACT IN AERS: 2.0000 | INHALATION_SPRAY | RESPIRATORY_TRACT | 0 refills | Status: AC | PRN

## 2023-05-26 MED ORDER — BENZONATATE 100 MG PO CAPS: 200.0000 mg | ORAL_CAPSULE | Freq: Three times a day (TID) | ORAL | 0 refills | Status: AC

## 2023-05-26 NOTE — ED Triage Notes (Signed)
Pt presents with a productive cough x 1 month. This morning she noticed blood in her sputum. She has runny nose x 1 week and a fever and chills that started last night.

## 2023-05-26 NOTE — ED Provider Notes (Signed)
[facility]    CSN: [id number] Arrival date & time: 05/26/23  1543      History   Chief Complaint Chief Complaint  Patient presents with   Fever    Cough for a couple of weeks, yellow/green sputum ,runny nose for several days. Chills tonight. - Entered by patient   Cough   Nasal Congestion    HPI Susan Robertson is a 57 y.o. female.    57 year old female with a past medical history significant for pulmonary emboli, OSA, and arthritis presents for evaluation of a productive cough for the past month.  She reports that it has been on and off and on cough but is worsened over the last week.  This morning she coughed up bloody sputum.  She reports that she has been taking a lot of NSAIDs recently as well.  She does endorse some wheezing.  A week ago she developed a runny nose but she denies any sinus pressure, ear pain, or sore throat.  Last night she developed a fever with a Tmax of 100.9 and chills.  She denies any shortness of breath.  She also denies chest pain. HPI  Past Medical History:  Diagnosis Date   Arthritis    Joint pain    Sleep apnea    in process of getting CPAP.    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      Home Medications    Prior to  Admission medications   Medication Sig Start Date End Date Taking? Authorizing Provider  albuterol (VENTOLIN HFA) 108 (90 Base) MCG/ACT inhaler Inhale 2 puffs into the lungs every 4 (four) hours as needed. 05/26/23  Yes Becky Augusta, NP  benzonatate (TESSALON) 100 MG capsule Take 2 capsules (200 mg total) by mouth every 8 (eight) hours. 05/26/23  Yes Becky Augusta, NP  levofloxacin (LEVAQUIN) 500 MG tablet Take 1 tablet (500 mg total) by mouth daily. 05/26/23  Yes Becky Augusta, NP  promethazine-dextromethorphan (PROMETHAZINE-DM) 6.25-15 MG/5ML syrup Take 5 mLs by mouth 4 (four) times daily as needed. 05/26/23  Yes Becky Augusta, NP  Spacer/Aero-Holding Chambers (AEROCHAMBER MV) inhaler Use as instructed 05/26/23  Yes Becky Augusta, NP  acetaminophen (TYLENOL) 500 MG tablet Take 500 mg by mouth every 6 (six) hours as needed.    [provider]    Family History Family History  Problem Relation Age of Onset   Colon polyps Father 64   Breast cancer Maternal Grandmother 38   Colon cancer Neg Hx     Social History Social History   Tobacco Use   Smoking status: Never   Smokeless tobacco: Never  Vaping Use   Vaping Use: Never used  Substance Use Topics   Alcohol use:  Yes    Comment: occas   Drug use: Never     Allergies   Patient has no known allergies.   Review of Systems Review of Systems  Constitutional:  Positive for chills and fever.  HENT:  Positive for congestion and rhinorrhea. Negative for ear pain, sinus pain and sore throat.   Respiratory:  Positive for cough and wheezing. Negative for shortness of breath.      Physical Exam Triage Vital Signs ED Triage Vitals  Enc Vitals Group     BP      Pulse      Resp      Temp      Temp src      SpO2      Weight      Height      Head Circumference      Peak Flow      Pain Score      Pain Loc      Pain Edu?      Excl. in GC?    No data found.  Updated Vital Signs BP (!) 155/102 (BP Location: Right Wrist)    Pulse 99   Temp 99.3 F (37.4 C) (Oral)   Resp 18   LMP 08/26/2018   SpO2 98%   Visual Acuity Right Eye Distance:   Left Eye Distance:   Bilateral Distance:    Right Eye Near:   Left Eye Near:    Bilateral Near:     Physical Exam Vitals reviewed.  Constitutional:      Appearance: Normal appearance. She is not ill-appearing.  HENT:     Head: Normocephalic and atraumatic.     Right Ear: Tympanic membrane, ear canal and external ear normal. There is no impacted cerumen.     Left Ear: Tympanic membrane, ear canal and external ear normal. There is no impacted cerumen.     Nose: Congestion and rhinorrhea present.     Comments: Nasal mucosa is mildly edematous and erythematous with clear discharge in both nares.    Mouth/Throat:     Mouth: Mucous membranes are moist.     Pharynx: Oropharynx is clear. No oropharyngeal exudate or posterior oropharyngeal erythema.  Cardiovascular:     Rate and Rhythm: Normal rate and regular rhythm.     Pulses: Normal pulses.     Heart sounds: Normal heart sounds. No murmur heard.    No friction rub. No gallop.  Pulmonary:     Effort: Pulmonary effort is normal.     Breath sounds: Normal breath sounds. No wheezing, rhonchi or rales.  Musculoskeletal:     Cervical back: Normal range of motion and neck supple. No tenderness.  Lymphadenopathy:     Cervical: No cervical adenopathy.  Skin:    General: Skin is warm and dry.     Capillary Refill: Capillary refill takes less than 2 seconds.     Findings: No erythema.  Neurological:     General: No focal deficit present.     Mental Status: She is alert and oriented to person, place, and time.      UC Treatments / Results  Labs (all labs ordered are listed, but only abnormal results are displayed) Labs Reviewed  SARS CORONAVIRUS 2 BY RT PCR    EKG   Radiology DG Chest 2 View  Result Date: 05/26/2023 CLINICAL DATA:  Productive cough. New onset bloody sputum this morning. Fever. EXAM: CHEST  - 2 VIEW COMPARISON:  None Available. FINDINGS: Multifocal patchy opacities  throughout the left lung typical of pneumonia. There may be minimal airspace disease in the right perihilar region. No pleural effusion. No pleural effusion. Normal pulmonary vasculature. No pneumothorax. Thoracic spondylosis with spurring. IMPRESSION: Multifocal patchy opacities throughout the left lung typical of pneumonia. Possible minimal airspace disease in the right perihilar region which may represent additional pneumonia. Electronically Signed   By: Narda Rutherford M.D.   On: 05/26/2023 16:56    Procedures Procedures (including critical care time)  Medications Ordered in UC Medications - No data to display  Initial Impression / Assessment and Plan / UC Course  I have reviewed the triage vital signs and the nursing notes.  Pertinent labs & imaging results that were available during my care of the patient were reviewed by me and considered in my medical decision making (see chart for details).   The patient is a nontoxic-appearing 57 year old female presenting for evaluation of 1 month worth of worsening respiratory symptoms as outlined in HPI above.  She does have a history of sleep apnea and states that she cleans her machine regularly and that she cleaned out her reservoir last night.  She has been using her CPAP every night.  She is hypertensive in clinic with a BP of 155/102 but she has no history of hypertension.  She has an elevated temp of 99.3 but not febrile in clinic.  She was febrile last night at 100.9.  She showed images on her phone of the sputum she brought up this morning which was bloody.  She is not in any respiratory distress and she is able to speak in full sentences without dyspnea or tachypnea.  Given the fact that she just started having a fever last night I will order a COVID PCR.  I am also can order chest x-ray to evaluate for the presence of pneumonia given the patient's bloody sputum  production.  Chest x-ray independently reviewed and evaluated by me.  Impression: Patient has diffuse, multilobar opacities in both lung fields.  There is also blunting of the costophrenic angles.  Radiology overread is pending. Radiology impression states there is multifocal opacity opacities throughout the left lung typical of pneumonia.  Possible minimal airspace disease in the right perihilar region which may Opill represent additional pneumonia.  COVID PCR is negative.  I will discharge patient on the diagnosis of community-acquired pneumonia and start her on Levaquin 500 mg daily for 7 days.  I will also prescribe an albuterol inhaler that she can use every 4-6 hours as needed for wheezing.  Tessalon Perles for cough during the day and Promethazine DM cough syrup for cough at bedtime.  Tylenol and/or ibuprofen as needed for fever.  I will have her monitor her blood pressure at home and follow-up with her PCP if her blood pressure remains elevated.  She is to go to the ER if she develops any headache, dizziness, change in vision, or chest pain.  Final Clinical Impressions(s) / UC Diagnoses   Final diagnoses:  Community acquired pneumonia, unspecified laterality  Hypertension, unspecified type     Discharge Instructions      Your chest x-ray shows that you have pneumonia in multiple lobes of your left lung and possibly your right as well.  Take the Levaquin 500 milligrams once daily for 7 days for treatment of your pneumonia.  Use the albuterol inhaler with a spacer, 2 puffs every 4-6 hours, as needed for any shortness of breath or wheezing you may experience.  Use the [employer]  every 8 hours during the day as needed for cough.  Take them with a small sip of water.  They may give you some numbness to the base of your tongue or metallic taste in her mouth, this is normal.  Use the Promethazine DM cough syrup at bedtime as needed for cough and congestion.  Use over-the-counter  Tylenol and/or ibuprofen according to the package instructions as needed for fever.  Monitor your blood pressure at home and if your readings remain elevated you need to follow-up with your primary care provider.  If you develop any headache, changes in vision, dizziness, chest pain, or increasing shortness of breath you need to go to the ER for evaluation.     ED Prescriptions     Medication Sig Dispense Auth. Provider   albuterol (VENTOLIN HFA) 108 (90 Base) MCG/ACT inhaler Inhale 2 puffs into the lungs every 4 (four) hours as needed. 18 g Becky Augusta, NP   Spacer/Aero-Holding Chambers (AEROCHAMBER MV) inhaler Use as instructed 1 each Becky Augusta, NP   benzonatate (TESSALON) 100 MG capsule Take 2 capsules (200 mg total) by mouth every 8 (eight) hours. 21 capsule Becky Augusta, NP   levofloxacin (LEVAQUIN) 500 MG tablet Take 1 tablet (500 mg total) by mouth daily. 7 tablet Becky Augusta, NP   promethazine-dextromethorphan (PROMETHAZINE-DM) 6.25-15 MG/5ML syrup Take 5 mLs by mouth 4 (four) times daily as needed. 118 mL Becky Augusta, NP      PDMP not reviewed this encounter.   Becky Augusta, NP 05/26/23 1721

## 2023-05-26 NOTE — Discharge Instructions (Signed)
Your chest x-ray shows that you have pneumonia in multiple lobes of your left lung and possibly your right as well.  Take the Levaquin 500 milligrams once daily for 7 days for treatment of your pneumonia.  Use the albuterol inhaler with a spacer, 2 puffs every 4-6 hours, as needed for any shortness of breath or wheezing you may experience.  Use the Tessalon Perles every 8 hours during the day as needed for cough.  Take them with a small sip of water.  They may give you some numbness to the base of your tongue or metallic taste in her mouth, this is normal.  Use the Promethazine DM cough syrup at bedtime as needed for cough and congestion.  Use over-the-counter Tylenol and/or ibuprofen according to the package instructions as needed for fever.  Monitor your blood pressure at home and if your readings remain elevated you need to follow-up with your primary care provider.  If you develop any headache, changes in vision, dizziness, chest pain, or increasing shortness of breath you need to go to the ER for evaluation.

## 2023-07-22 ENCOUNTER — Other Ambulatory Visit (HOSPITAL_COMMUNITY): Payer: Self-pay | Admitting: Internal Medicine

## 2023-07-22 DIAGNOSIS — M5416 Radiculopathy, lumbar region: Secondary | ICD-10-CM

## 2023-08-06 ENCOUNTER — Ambulatory Visit (HOSPITAL_COMMUNITY)
Admission: RE | Admit: 2023-08-06 | Discharge: 2023-08-06 | Disposition: A | Discharge status: Home / Self Care | Payer: BC Managed Care – PPO | Source: Ambulatory Visit | Attending: Internal Medicine | Admitting: Internal Medicine

## 2023-08-06 DIAGNOSIS — M5416 Radiculopathy, lumbar region: Secondary | ICD-10-CM | POA: Diagnosis not present

## 2023-10-22 IMAGING — US US BREAST*L* LIMITED INC AXILLA
1 series · 13 of 13 positions shown · non-contrast
Comparison: Previous exam(s).

CLINICAL DATA: Screening recall for a possible left breast mass.

EXAM:
DIGITAL DIAGNOSTIC UNILATERAL LEFT MAMMOGRAM WITH TOMOSYNTHESIS AND
CAD; ULTRASOUND LEFT BREAST LIMITED
TECHNIQUE: Left digital diagnostic mammography and breast tomosynthesis was
performed. The images were evaluated with computer-aided detection.;
Targeted ultrasound examination of the left breast was performed.

[Series 1: us breast*left* limited inc axilla · 0.09mm/px · 13 of 13 slices shown]
[im 1/13]
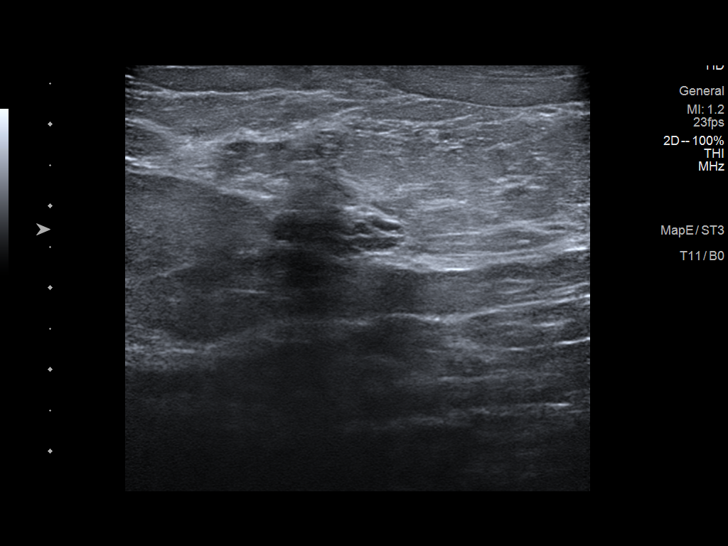
[im 2/13]
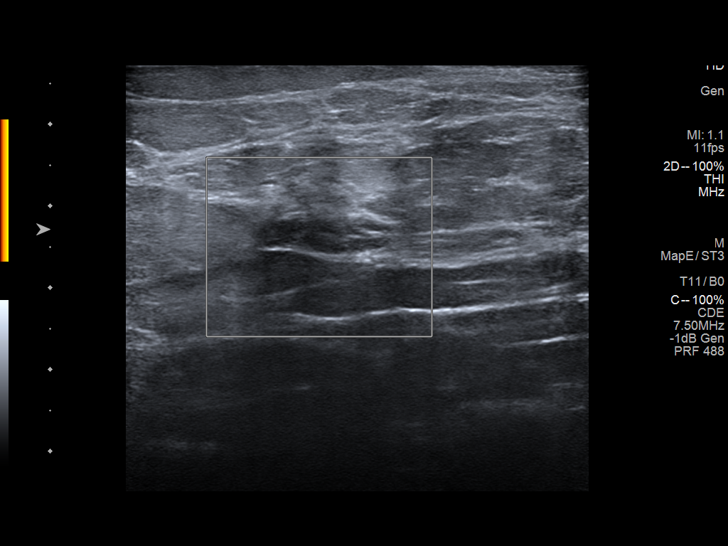
[im 3/13]
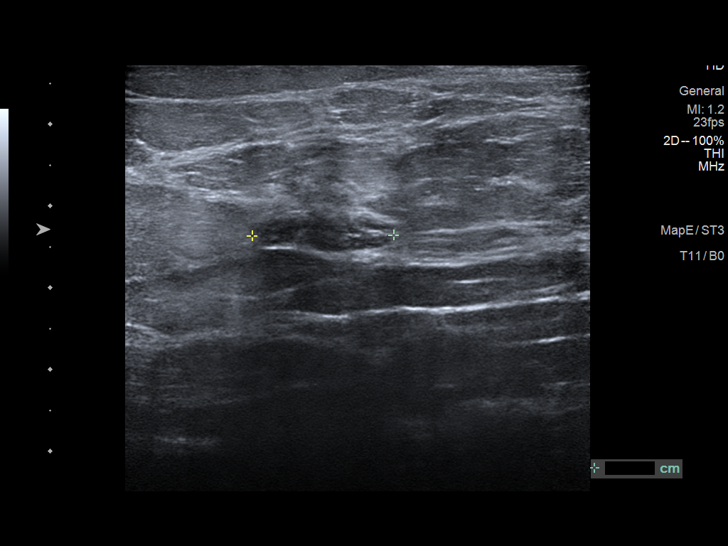
[im 4/13]
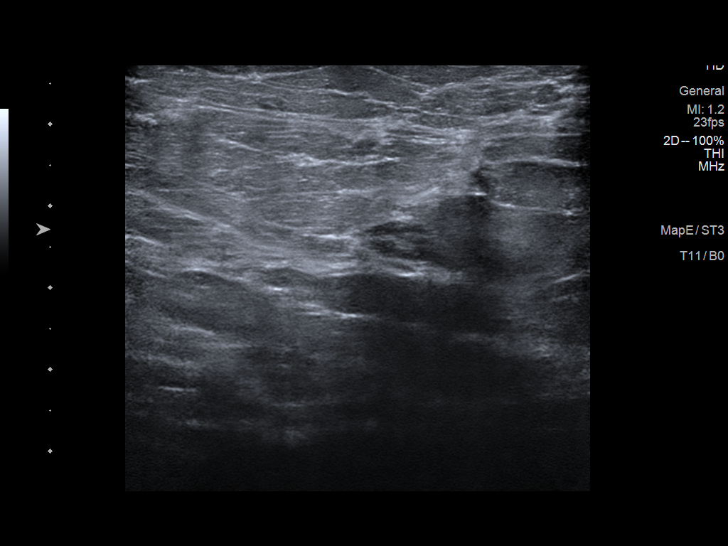
[im 5/13]
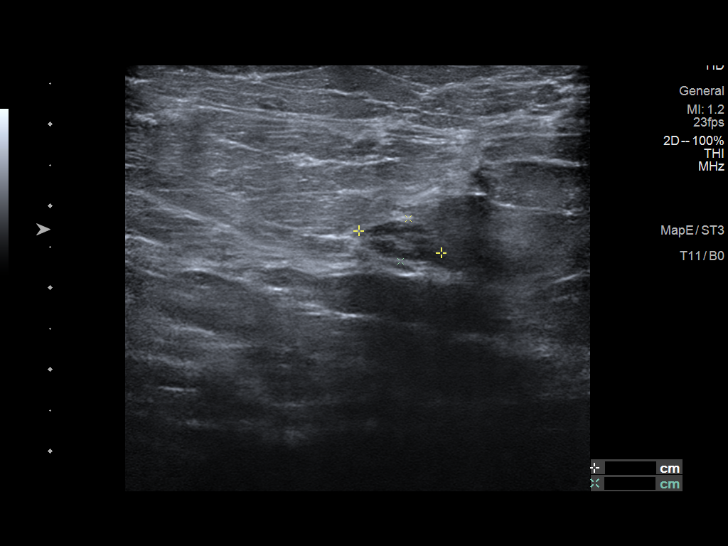
[im 6/13]
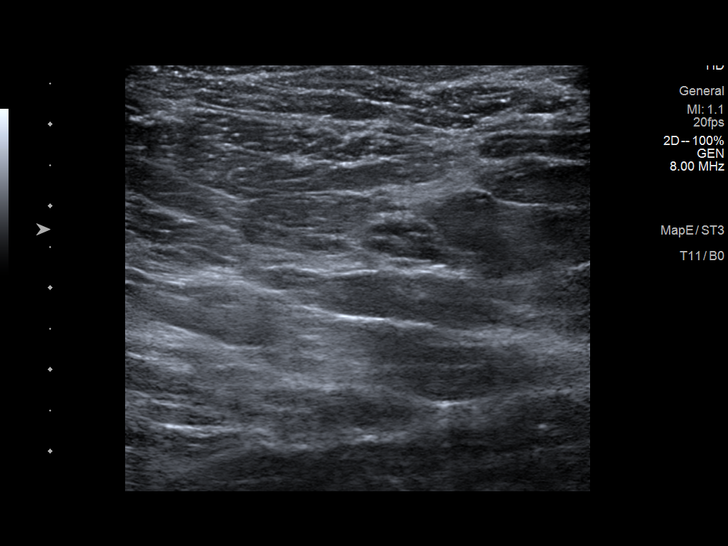
[im 7/13]
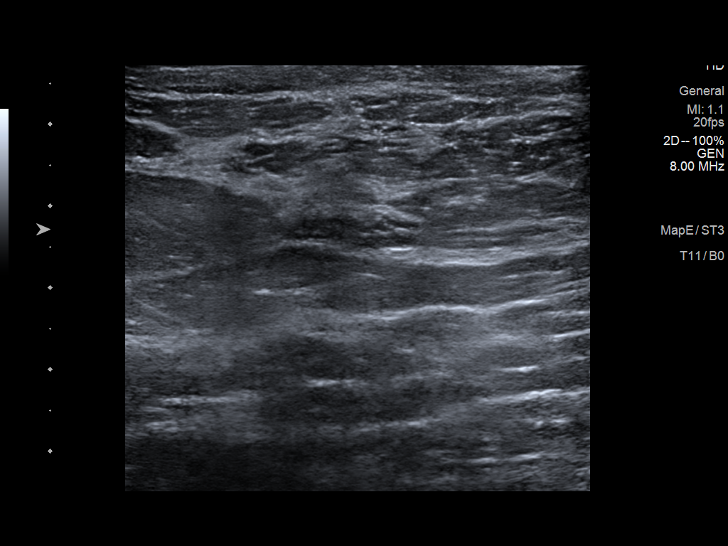
[im 8/13]
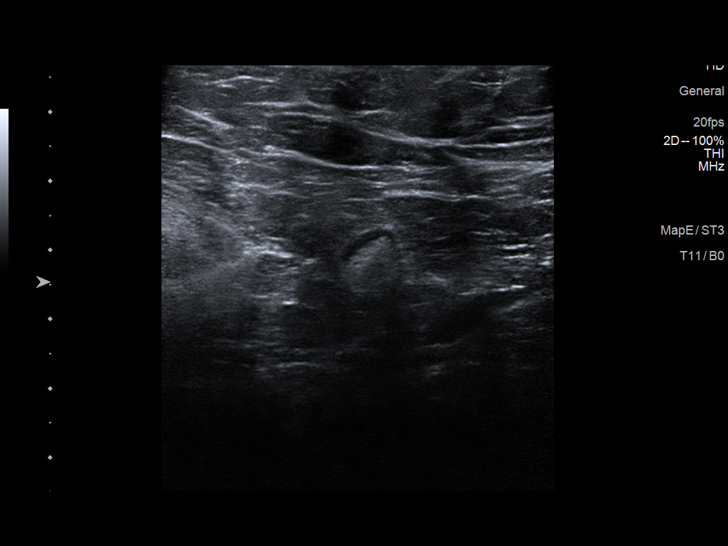
[im 9/13]
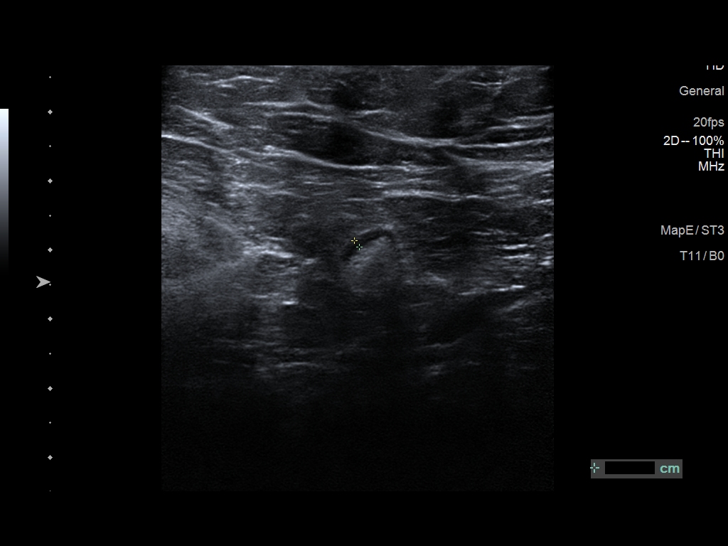
[im 10/13]
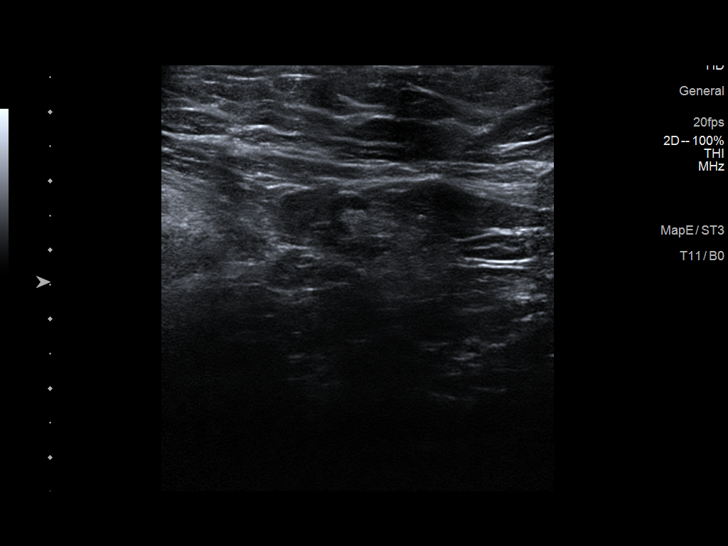
[im 11/13]
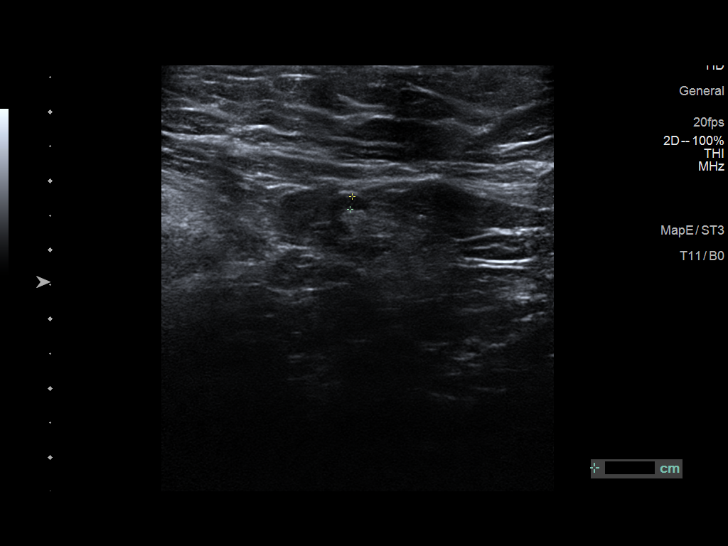
[im 12/13]
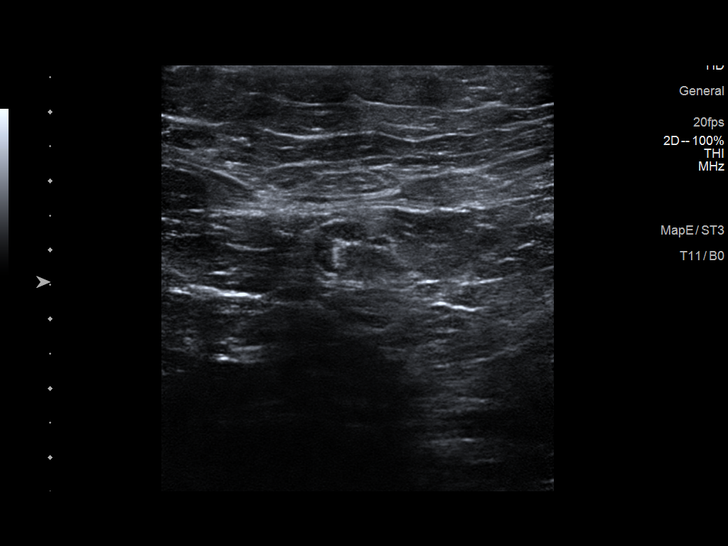
[im 13/13]
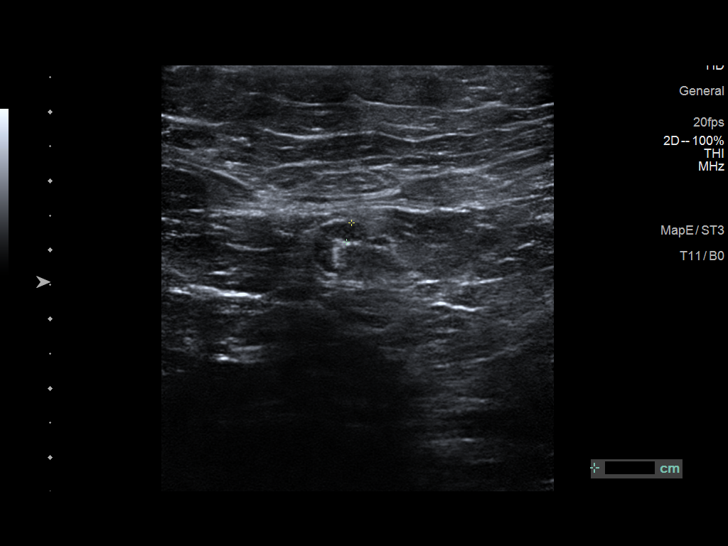

[13 of 13 positions shown; findings below may reference images not displayed]

ACR Breast Density Category b: There are scattered areas of
fibroglandular density.
FINDINGS: Spot compression tomosynthesis images through the upper outer
anterior left breast demonstrates a low-density lobulated obscured
mass.

Ultrasound targeted to the left breast at 2 o'clock, 4 cm from the
nipple demonstrates a hypoechoic heterogeneous irregular mass with
indistinct margins measuring 1.7 x 0.5 x 1.0 cm

Ultrasound of the left axilla demonstrates multiple normal-appearing
lymph nodes.
IMPRESSION: 1. There is an indeterminate 1.7 cm mass in the left breast at 1
o'clock.

2.  No evidence of left axillary lymphadenopathy.

RECOMMENDATION:
Ultrasound guided biopsy is recommended for the left breast mass.
This has been scheduled for 03/13/2022 at [DATE] a.m.

I have discussed the findings and recommendations with the patient.
If applicable, a reminder letter will be sent to the patient
regarding the next appointment.

BI-RADS CATEGORY  4: Suspicious.

## 2023-11-04 IMAGING — MG MM BREAST LOCALIZATION CLIP
2 series · 2 of 2 positions shown · non-contrast
Comparison: Previous exam(s).

CLINICAL DATA: Confirmation of clip placement after
ultrasound-guided core needle biopsy of an indeterminate involving
quadrant LEFT the 1 o'clock position 4 cm from nipple.

EXAM:
DIAGNOSTIC LEFT MAMMOGRAM POST ULTRASOUND BIOPSY

[L CC]
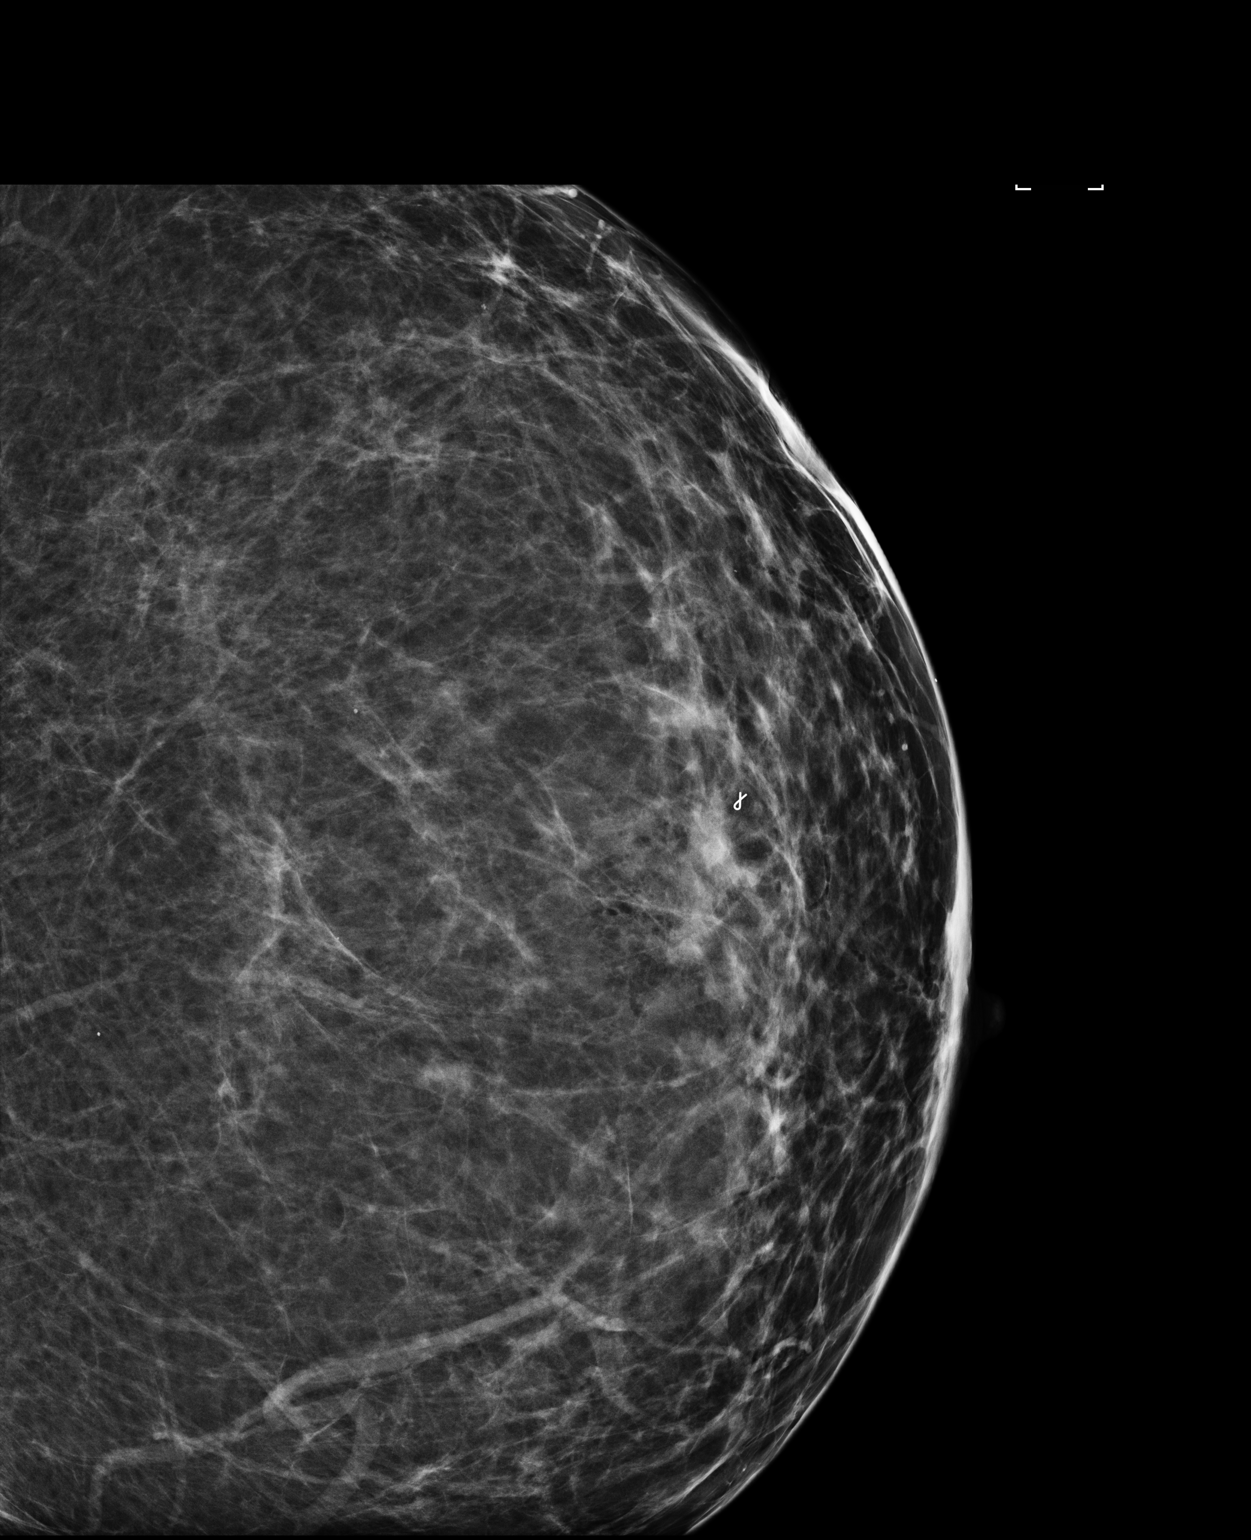

[L ML]
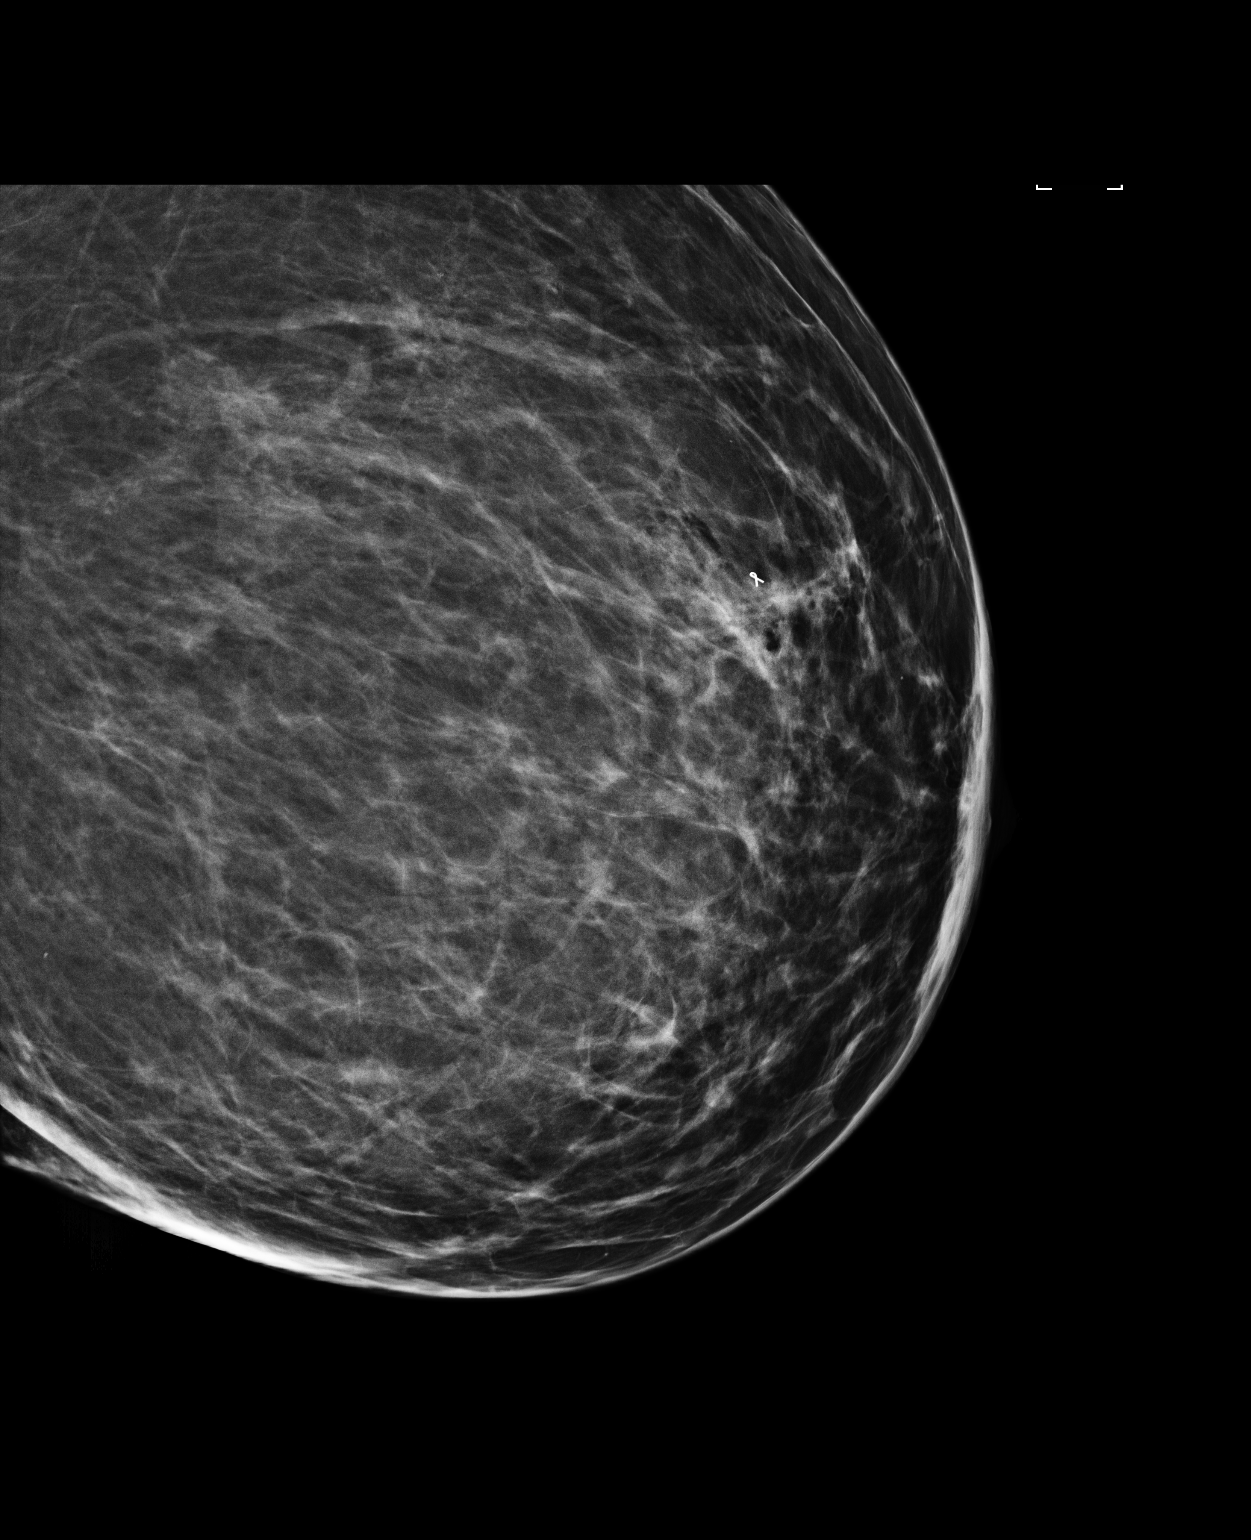

[2 of 2 positions shown; findings below may reference images not displayed]

FINDINGS: Full field CC and mediolateral images were obtained following
ultrasound guided biopsy of an indeterminate mass involving the
UPPER OUTER QUADRANT of the LEFT breast. The ribbon shaped biopsy
marking clip is appropriately positioned at the superolateral margin
of the biopsied mass.

Expected post biopsy changes are present without evidence of
hematoma.
IMPRESSION: Appropriate positioning of the ribbon shaped biopsy marking clip at
the superolateral margin of the biopsied mass in the UPPER OUTER
QUADRANT of the LEFT breast.

Final Assessment: Post Procedure Mammograms for Marker Placement

## 2024-09-20 ENCOUNTER — Other Ambulatory Visit: Payer: Self-pay | Admitting: Neurosurgery

## 2024-09-20 DIAGNOSIS — M48062 Spinal stenosis, lumbar region with neurogenic claudication: Secondary | ICD-10-CM

## 2024-09-25 ENCOUNTER — Ambulatory Visit
Admission: RE | Admit: 2024-09-25 | Discharge: 2024-09-25 | Disposition: A | Discharge status: Home / Self Care | Source: Ambulatory Visit | Attending: Neurosurgery | Admitting: Neurosurgery

## 2024-09-25 DIAGNOSIS — M48062 Spinal stenosis, lumbar region with neurogenic claudication: Secondary | ICD-10-CM | POA: Insufficient documentation
